# Patient Record
Sex: Male | Born: 1980 | Marital: Married | Smoking: Never smoker
Health system: Southern US, Community
[De-identification: ages and names within clinical notes are randomized; demographics above are authoritative.]

## PROBLEM LIST (undated history)

## (undated) HISTORY — PX: SEPTOPLASTY: SUR1290

---

## 2007-02-23 ENCOUNTER — Ambulatory Visit: Payer: Self-pay | Admitting: Unknown Physician Specialty

## 2008-04-28 DIAGNOSIS — G43009 Migraine without aura, not intractable, without status migrainosus: Secondary | ICD-10-CM | POA: Insufficient documentation

## 2009-04-05 DIAGNOSIS — K589 Irritable bowel syndrome without diarrhea: Secondary | ICD-10-CM | POA: Insufficient documentation

## 2013-09-07 LAB — TSH: TSH: 1.36 u[IU]/mL (ref 0.41–5.90)

## 2013-09-07 LAB — CBC AND DIFFERENTIAL
HEMATOCRIT: 45 % (ref 41–53)
HEMOGLOBIN: 15.7 g/dL (ref 13.5–17.5)
NEUTROS ABS: 47 /uL
Platelets: 299 10*3/uL (ref 150–399)
WBC: 7 10*3/mL

## 2013-09-07 LAB — BASIC METABOLIC PANEL
BUN: 17 mg/dL (ref 4–21)
Creatinine: 1 mg/dL (ref 0.6–1.3)
GLUCOSE: 95 mg/dL
POTASSIUM: 4.9 mmol/L (ref 3.4–5.3)
SODIUM: 141 mmol/L (ref 137–147)

## 2013-09-07 LAB — LIPID PANEL
CHOLESTEROL: 257 mg/dL — AB (ref 0–200)
HDL: 50 mg/dL (ref 35–70)
LDL CALC: 177 mg/dL
Triglycerides: 149 mg/dL (ref 40–160)

## 2013-09-07 LAB — HEPATIC FUNCTION PANEL
ALT: 41 U/L — AB (ref 10–40)
AST: 27 U/L (ref 14–40)
Alkaline Phosphatase: 133 U/L — AB (ref 25–125)
Bilirubin, Total: 0.4 mg/dL

## 2015-04-10 ENCOUNTER — Ambulatory Visit: Payer: Self-pay | Admitting: Physician Assistant

## 2015-04-10 ENCOUNTER — Ambulatory Visit (INDEPENDENT_AMBULATORY_CARE_PROVIDER_SITE_OTHER): Payer: Commercial Managed Care - HMO | Admitting: Physician Assistant

## 2015-04-10 ENCOUNTER — Encounter: Payer: Self-pay | Admitting: Physician Assistant

## 2015-04-10 VITALS — BP 118/90 | HR 72 | Temp 98.8°F | Resp 16 | Wt 223.4 lb

## 2015-04-10 DIAGNOSIS — J069 Acute upper respiratory infection, unspecified: Secondary | ICD-10-CM | POA: Diagnosis not present

## 2015-04-10 DIAGNOSIS — H6503 Acute serous otitis media, bilateral: Secondary | ICD-10-CM

## 2015-04-10 DIAGNOSIS — M7050 Other bursitis of knee, unspecified knee: Secondary | ICD-10-CM | POA: Insufficient documentation

## 2015-04-10 DIAGNOSIS — R05 Cough: Secondary | ICD-10-CM

## 2015-04-10 DIAGNOSIS — R059 Cough, unspecified: Secondary | ICD-10-CM

## 2015-04-10 DIAGNOSIS — F32A Depression, unspecified: Secondary | ICD-10-CM | POA: Insufficient documentation

## 2015-04-10 DIAGNOSIS — F329 Major depressive disorder, single episode, unspecified: Secondary | ICD-10-CM | POA: Insufficient documentation

## 2015-04-10 DIAGNOSIS — E669 Obesity, unspecified: Secondary | ICD-10-CM | POA: Insufficient documentation

## 2015-04-10 MED ORDER — IBUPROFEN 800 MG PO TABS: 800.0000 mg | ORAL_TABLET | Freq: Three times a day (TID) | ORAL | Status: DC | PRN

## 2015-04-10 MED ORDER — AMOXICILLIN-POT CLAVULANATE 875-125 MG PO TABS: 1.0000 | ORAL_TABLET | Freq: Two times a day (BID) | ORAL | Status: DC

## 2015-04-10 MED ORDER — HYDROCODONE-HOMATROPINE 5-1.5 MG/5ML PO SYRP: 5.0000 mL | ORAL_SOLUTION | Freq: Three times a day (TID) | ORAL | Status: DC | PRN

## 2015-04-10 NOTE — Progress Notes (Signed)
Patient ID: Shane Guerrero, Guerrero   DOB: 01-07-1981, 35 y.o.   MRN: [id number]   Patient: Shane Guerrero    DOB: 01-07-1981   34 y.o.   MRN: [id number] Visit Date: 04/10/2015  Today's Provider: Margaretann LovelessJennifer M Sharise Lippy, PA-C   Chief Complaint  Patient presents with  . URI   Subjective:    URI  This is a new problem. The current episode started in the past 7 days. The problem has been unchanged. The maximum temperature recorded prior to his arrival was 101 - 101.9 F. The fever has been present for less than 1 day. Associated symptoms include congestion, coughing, headaches, nausea, rhinorrhea, a sore throat and wheezing. Pertinent negatives include no abdominal pain, chest pain, diarrhea, ear pain, plugged ear sensation, rash, sinus pain, sneezing or vomiting. Associated symptoms comments: Fever . He has tried acetaminophen and NSAIDs (OTC cough medication) for the symptoms. The treatment provided mild relief.  He has been around sick contacts.    Previous Medications   IBUPROFEN (ADVIL,MOTRIN) 800 MG TABLET    Take by mouth.   No Known Allergies  Review of Systems  Constitutional: Positive for fever and chills.  HENT: Positive for congestion, rhinorrhea and sore throat. Negative for ear pain and sneezing.   Eyes: Negative.   Respiratory: Positive for cough and wheezing.   Cardiovascular: Negative.  Negative for chest pain.  Gastrointestinal: Positive for nausea. Negative for vomiting, abdominal pain and diarrhea.  Endocrine: Negative.   Genitourinary: Negative.   Musculoskeletal: Negative.   Skin: Negative.  Negative for rash.  Allergic/Immunologic: Negative.   Neurological: Positive for headaches. Negative for dizziness.  Hematological: Negative.   Psychiatric/Behavioral: Negative.     Social History  Substance Use Topics  . Smoking status: Never Smoker   . Smokeless tobacco: Never Used  . Alcohol Use: 0.0 oz/week    0 Standard drinks or equivalent per week     Comment:  occasionally   Objective:   BP 118/90 mmHg  Pulse 72  Temp(Src) 98.8 F (37.1 C) (Oral)  Resp 16  Wt 223 lb 6.4 oz (101.334 kg)  Physical Exam  Constitutional: He appears well-developed and well-nourished. No distress.  HENT:  Head: Normocephalic and atraumatic.  Right Ear: Hearing, external ear and ear canal normal. Tympanic membrane is not erythematous and not bulging. A middle ear effusion is present.  Left Ear: Hearing and ear canal normal. Tympanic membrane is not erythematous and not bulging. A middle ear effusion is present.  Nose: Mucosal edema and rhinorrhea present. Right sinus exhibits no maxillary sinus tenderness and no frontal sinus tenderness. Left sinus exhibits no maxillary sinus tenderness and no frontal sinus tenderness.  Mouth/Throat: Uvula is midline and mucous membranes are normal. Posterior oropharyngeal erythema present. No oropharyngeal exudate or posterior oropharyngeal edema.  Eyes: Conjunctivae and EOM are normal. Pupils are equal, round, and reactive to light. Right eye exhibits no discharge. Left eye exhibits no discharge.  Neck: Normal range of motion. Neck supple. No tracheal deviation present. No Brudzinski's sign and no Kernig's sign noted. No thyromegaly present.  Cardiovascular: Normal rate, regular rhythm and normal heart sounds.  Exam reveals no gallop and no friction rub.   No murmur heard. Pulmonary/Chest: Effort normal and breath sounds normal. No stridor. No respiratory distress. He has no wheezes. He has no rales.  Lymphadenopathy:    He has cervical adenopathy.  Skin: Skin is warm and dry. He is not diaphoretic.  Vitals reviewed.  Assessment & Plan:     1. Upper respiratory infection Worsening symptoms with high fever and otitis media that has not responded to OTC medications.  Will treat with augmentin as below.  Advised he may continue Mucinex DM for congestion.  He needs to stay well hydrated and get plenty of rest.  May alternate  tylenol and IBU if needed for fevers and body aches. He is to call the office if symptoms fail to improve or worsen. - amoxicillin-clavulanate (AUGMENTIN) 875-125 MG tablet; Take 1 tablet by mouth 2 (two) times daily.  Dispense: 20 tablet; Refill: 0  2. Bilateral acute serous otitis media, recurrence not specified See above medical treatment plan. - amoxicillin-clavulanate (AUGMENTIN) 875-125 MG tablet; Take 1 tablet by mouth 2 (two) times daily.  Dispense: 20 tablet; Refill: 0  3. Cough Worsening nighttime cough that is affecting sleep.  Will give hycodan cough syrup as below. Advised of drowsiness precautions. He is to take Mucinex DM for daytime cough. Stay well hydrated.  Call if no symptom improvement. - HYDROcodone-homatropine (HYCODAN) 5-1.5 MG/5ML syrup; Take 5 mLs by mouth every 8 (eight) hours as needed for cough.  Dispense: 180 mL; Refill: 0  4. Bursitis of knee, unspecified laterality Stable.  Diagnosis pulled for medication refill as below. Continue current medical treatment plan. - ibuprofen (ADVIL,MOTRIN) 800 MG tablet; Take 1 tablet (800 mg total) by mouth every 8 (eight) hours as needed.  Dispense: 30 tablet; Refill: 3   Follow up: No Follow-up on file.

## 2015-04-10 NOTE — Patient Instructions (Signed)
Upper Respiratory Infection, Adult  Most upper respiratory infections (URIs) are a viral infection of the air passages leading to the lungs. A URI affects the nose, throat, and upper air passages. The most common type of URI is nasopharyngitis and is typically referred to as "the common cold."  URIs run their course and usually go away on their own. Most of the time, a URI does not require medical attention, but sometimes a bacterial infection in the upper airways can follow a viral infection. This is called a secondary infection. Sinus and middle ear infections are common types of secondary upper respiratory infections.  Bacterial pneumonia can also complicate a URI. A URI can worsen asthma and chronic obstructive pulmonary disease (COPD). Sometimes, these complications can require emergency medical care and may be life threatening.   CAUSES  Almost all URIs are caused by viruses. A virus is a type of germ and can spread from one person to another.   RISKS FACTORS  You may be at risk for a URI if:    You smoke.    You have chronic heart or lung disease.   You have a weakened defense (immune) system.    You are very young or very old.    You have nasal allergies or asthma.   You work in crowded or poorly ventilated areas.   You work in health care facilities or schools.  SIGNS AND SYMPTOMS   Symptoms typically develop 2-3 days after you come in contact with a cold virus. Most viral URIs last 7-10 days. However, viral URIs from the influenza virus (flu virus) can last 14-18 days and are typically more severe. Symptoms may include:    Runny or stuffy (congested) nose.    Sneezing.    Cough.    Sore throat.    Headache.    Fatigue.    Fever.    Loss of appetite.    Pain in your forehead, behind your eyes, and over your cheekbones (sinus pain).   Muscle aches.   DIAGNOSIS   Your health care provider may diagnose a URI by:   Physical exam.   Tests to check that your symptoms are not due to  another condition such as:   Strep throat.   Sinusitis.   Pneumonia.   Asthma.  TREATMENT   A URI goes away on its own with time. It cannot be cured with medicines, but medicines may be prescribed or recommended to relieve symptoms. Medicines may help:   Reduce your fever.   Reduce your cough.   Relieve nasal congestion.  HOME CARE INSTRUCTIONS    Take medicines only as directed by your health care provider.    Gargle warm saltwater or take cough drops to comfort your throat as directed by your health care provider.   Use a warm mist humidifier or inhale steam from a shower to increase air moisture. This may make it easier to breathe.   Drink enough fluid to keep your urine clear or pale yellow.    Eat soups and other clear broths and maintain good nutrition.    Rest as needed.    Return to work when your temperature has returned to normal or as your health care provider advises. You may need to stay home longer to avoid infecting others. You can also use a face mask and careful hand washing to prevent spread of the virus.   Increase the usage of your inhaler if you have asthma.    Do not   use any tobacco products, including cigarettes, chewing tobacco, or electronic cigarettes. If you need help quitting, ask your health care provider.  PREVENTION   The best way to protect yourself from getting a cold is to practice good hygiene.    Avoid oral or hand contact with people with cold symptoms.    Wash your hands often if contact occurs.   There is no clear evidence that vitamin C, vitamin E, echinacea, or exercise reduces the chance of developing a cold. However, it is always recommended to get plenty of rest, exercise, and practice good nutrition.   SEEK MEDICAL CARE IF:    You are getting worse rather than better.    Your symptoms are not controlled by medicine.    You have chills.   You have worsening shortness of breath.   You have brown or red mucus.   You have yellow or brown nasal  discharge.   You have pain in your face, especially when you bend forward.   You have a fever.   You have swollen neck glands.   You have pain while swallowing.   You have white areas in the back of your throat.  SEEK IMMEDIATE MEDICAL CARE IF:    You have severe or persistent:    Headache.    Ear pain.    Sinus pain.    Chest pain.   You have chronic lung disease and any of the following:    Wheezing.    Prolonged cough.    Coughing up blood.    A change in your usual mucus.   You have a stiff neck.   You have changes in your:    Vision.    Hearing.    Thinking.    Mood.  MAKE SURE YOU:    Understand these instructions.   Will watch your condition.   Will get help right away if you are not doing well or get worse.     This information is not intended to replace advice given to you by your health care provider. Make sure you discuss any questions you have with your health care provider.     Document Released: 07/15/2000 Document Revised: 06/05/2014 Document Reviewed: 04/26/2013  Elsevier Interactive Patient Education 2016 [employer]  Serous Otitis Media  Serous otitis media is fluid in the middle ear space. This space contains the bones for hearing and air. Air in the middle ear space helps to transmit sound.   The air gets there through the eustachian tube. This tube goes from the back of the nose (nasopharynx) to the middle ear space. It keeps the pressure in the middle ear the same as the outside world. It also helps to drain fluid from the middle ear space.  CAUSES   Serous otitis media occurs when the eustachian tube gets blocked. Blockage can come from:   Ear infections.   Colds and other upper respiratory infections.   Allergies.   Irritants such as cigarette smoke.   Sudden changes in air pressure (such as descending in an airplane).   Enlarged adenoids.   A mass in the nasopharynx.  During colds and upper respiratory infections, the middle ear space can become temporarily filled with  fluid. This can happen after an ear infection also. Once the infection clears, the fluid will generally drain out of the ear through the eustachian tube. If it does not, then serous otitis media occurs.  SIGNS AND SYMPTOMS    Hearing loss.   A   feeling of fullness in the ear, without pain.   Young children may not show any symptoms but may show slight behavioral changes, such as agitation, ear pulling, or crying.  DIAGNOSIS   Serous otitis media is diagnosed by an ear exam. Tests may be done to check on the movement of the eardrum. Hearing exams may also be done.  TREATMENT   The fluid most often goes away without treatment. If allergy is the cause, allergy treatment may be helpful. Fluid that persists for several months may require minor surgery. A small tube is placed in the eardrum to:   Drain the fluid.   Restore the air in the middle ear space.  In certain situations, antibiotic medicines are used to avoid surgery. Surgery may be done to remove enlarged adenoids (if this is the cause).  HOME CARE INSTRUCTIONS    Keep children away from tobacco smoke.   Keep all follow-up visits as directed by your health care provider.  SEEK MEDICAL CARE IF:    Your hearing is not better in 3 months.   Your hearing is worse.   You have ear pain.   You have drainage from the ear.   You have dizziness.   You have serous otitis media only in one ear or have any bleeding from your nose (epistaxis).   You notice a lump on your neck.  MAKE SURE YOU:   Understand these instructions.    Will watch your condition.    Will get help right away if you are not doing well or get worse.      This information is not intended to replace advice given to you by your health care provider. Make sure you discuss any questions you have with your health care provider.     Document Released: 04/11/2003 Document Revised: 02/09/2014 Document Reviewed: 08/16/2012  Elsevier Interactive Patient Education 2016 [employer] Inc[employer]

## 2015-08-08 ENCOUNTER — Ambulatory Visit (INDEPENDENT_AMBULATORY_CARE_PROVIDER_SITE_OTHER): Payer: Commercial Managed Care - HMO | Admitting: Family Medicine

## 2015-08-08 ENCOUNTER — Encounter: Payer: Self-pay | Admitting: Family Medicine

## 2015-08-08 VITALS — BP 110/82 | HR 60 | Temp 98.2°F | Resp 16 | Wt 227.0 lb

## 2015-08-08 DIAGNOSIS — J40 Bronchitis, not specified as acute or chronic: Secondary | ICD-10-CM | POA: Insufficient documentation

## 2015-08-08 MED ORDER — AZITHROMYCIN 250 MG PO TABS: ORAL_TABLET | ORAL | Status: DC

## 2015-08-08 NOTE — Progress Notes (Signed)
       Patient: Shane Guerrero Male    DOB: Oct 24, 1980   34 y.o.   MRN: [id number] Visit Date: 08/08/2015  Today's Provider: Megan Mansichard Gilbert Jr, MD   Chief Complaint  Patient presents with  . URI   Subjective:    URI  This is a new problem. Episode onset: x several weeks. The problem has been unchanged. There has been no fever. Associated symptoms include congestion, coughing (dry), headaches (unchanged per pt), rhinorrhea, sinus pain (at onset; has improved), a sore throat and wheezing. Pertinent negatives include no abdominal pain, chest pain, ear pain, nausea, neck pain, plugged ear sensation, sneezing, swollen glands or vomiting. Treatments tried: Mucinex. The treatment provided no relief.       No Known Allergies Current Meds  Medication Sig  . ibuprofen (ADVIL,MOTRIN) 800 MG tablet Take 1 tablet (800 mg total) by mouth every 8 (eight) hours as needed.    Review of Systems  HENT: Positive for congestion, rhinorrhea and sore throat. Negative for ear pain and sneezing.   Eyes: Negative.   Respiratory: Positive for cough (dry) and wheezing.   Cardiovascular: Negative for chest pain.  Gastrointestinal: Negative for nausea, vomiting and abdominal pain.  Musculoskeletal: Negative for neck pain.  Neurological: Positive for headaches (unchanged per pt).    Social History  Substance Use Topics  . Smoking status: Never Smoker   . Smokeless tobacco: Never Used  . Alcohol Use: 0.0 oz/week    0 Standard drinks or equivalent per week     Comment: occasionally   Objective:   BP 110/82 mmHg  Pulse 60  Temp(Src) 98.2 F (36.8 C) (Oral)  Resp 16  Wt 227 lb (102.967 kg)  SpO2 98%  Physical Exam  Constitutional: He is oriented to person, place, and time. He appears well-developed and well-nourished.  HENT:  Head: Normocephalic and atraumatic.  Right Ear: Tympanic membrane and external ear normal.  Left Ear: Tympanic membrane and external ear normal.  Nose: Nose normal.    Mouth/Throat: Oropharynx is clear and moist. No oropharyngeal exudate.  Eyes: Conjunctivae are normal. Pupils are equal, round, and reactive to light.  Neck: Normal range of motion. Neck supple. No thyromegaly present.  Cardiovascular: Normal rate, regular rhythm and normal heart sounds.   Pulmonary/Chest: Effort normal and breath sounds normal. No respiratory distress.  Abdominal: Soft.  Lymphadenopathy:    He has no cervical adenopathy.  Neurological: He is alert and oriented to person, place, and time.  Skin: Skin is warm and dry.  Psychiatric: He has a normal mood and affect. His behavior is normal. Judgment and thought content normal.        Assessment & Plan:     1. Bronchitis New problem. Treat as below. Call if sx worsen or do not improve. - azithromycin (ZITHROMAX) 250 MG tablet; Take 2 tablets on the first day, and then 1 tablet each remaining day until gone.  Dispense: 6 tablet; Refill: 0     Patient seen and examined by Julieanne Mansonichard Gilbert, MD, and note scribed by Allene DillonEmily Drozdowski, CMA.  I have done the exam and reviewed the above chart and it is accurate to the best of my knowledge.  Richard Wendelyn BreslowGilbert Jr, MD  [facility]

## 2015-09-24 ENCOUNTER — Ambulatory Visit (INDEPENDENT_AMBULATORY_CARE_PROVIDER_SITE_OTHER): Payer: Commercial Managed Care - HMO | Admitting: Family Medicine

## 2015-09-24 ENCOUNTER — Encounter: Payer: Self-pay | Admitting: Family Medicine

## 2015-09-24 VITALS — BP 118/82 | HR 60 | Resp 12 | Wt 230.0 lb

## 2015-09-24 DIAGNOSIS — R6882 Decreased libido: Secondary | ICD-10-CM | POA: Diagnosis not present

## 2015-09-24 DIAGNOSIS — F329 Major depressive disorder, single episode, unspecified: Secondary | ICD-10-CM | POA: Diagnosis not present

## 2015-09-24 DIAGNOSIS — R5383 Other fatigue: Secondary | ICD-10-CM

## 2015-09-24 DIAGNOSIS — F32A Depression, unspecified: Secondary | ICD-10-CM

## 2015-09-24 MED ORDER — CITALOPRAM HYDROBROMIDE 20 MG PO TABS: 20.0000 mg | ORAL_TABLET | Freq: Every day | ORAL | 12 refills | Status: DC

## 2015-09-24 NOTE — Progress Notes (Signed)
Subjective:  HPI  Patient is here to discuss depression. This was an issue years ago and patient and wife state he took Celexa at that time after Cymbalta gave patient excessive sweatiness. He is not sure why Celexa was stopped and he is not sure if it worked well for him or not since its been a long time. For the past 2 months he has gotten depressed again, symptoms are fatigue, lost interest in things, easily irritates, anxious at times. They are trying to buy a house and have been working on finances and patient works a lot per wife. [facility] 09/24/2015  Decreased Interest 2  Down, Depressed, Hopeless 0  PHQ - 2 Score 2  Altered sleeping 0  Tired, decreased energy 2  Change in appetite 1  Feeling bad or failure about yourself  1  Trouble concentrating 2  Moving slowly or fidgety/restless 1  Suicidal thoughts 0  PHQ-9 Score 9  Difficult doing work/chores Somewhat difficult   Prior to Admission medications   Medication Sig Start Date End Date Taking? Authorizing Provider  ibuprofen (ADVIL,MOTRIN) 800 MG tablet Take 1 tablet (800 mg total) by mouth every 8 (eight) hours as needed. 04/10/15  Yes Margaretann LovelessJennifer M Burnette, PA-C    Patient Active Problem List   Diagnosis Date Noted  . Bronchitis 08/08/2015  . Bursitis of knee 04/10/2015  . Clinical depression 04/10/2015  . Adiposity 04/10/2015  . 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 Migraine without aura and responsive to treatment 04/28/2008    No past medical history on file.  Social History   Social History  . Marital status: Married    Spouse name: N/A  . Number of children: N/A  . Years of education: N/A   Occupational History  . Not on file.   Social History Main Topics  . Smoking status: Never Smoker  . Smokeless tobacco: Never Used  . Alcohol use 0.0 oz/week     Comment: occasionally  . Drug use: No  . Sexual activity: Not on file   Other Topics Concern  . Not on file   Social History Narrative    . No narrative on file    No Known Allergies  Review of Systems  Constitutional: Positive for malaise/fatigue.  Respiratory: Negative.   Cardiovascular: Negative.   Musculoskeletal: Negative.   Psychiatric/Behavioral: Positive for depression. Negative for memory loss, substance abuse and suicidal ideas. The patient is nervous/anxious (some). The patient does not have insomnia.     Immunization History  Administered Date(s) Administered  . Tdap 08/16/2013   Objective:  BP 118/82   Pulse 60   Resp 12   Wt 230 lb (104.3 kg)   Physical Exam  Constitutional: He is oriented to person, place, and time and well-developed, well-nourished, and in no distress.  HENT:  Head: Normocephalic and atraumatic.  Left Ear: External ear normal.  Nose: Nose normal.  Eyes: Conjunctivae are normal. Pupils are equal, round, and reactive to light.  Neck: Normal range of motion. Neck supple. No thyromegaly present.  Cardiovascular: Normal rate, regular rhythm, normal heart sounds and intact distal pulses.   No murmur heard. Pulmonary/Chest: Effort normal and breath sounds normal. No respiratory distress. He has no wheezes.  Abdominal: Soft.  Musculoskeletal: Normal range of motion. He exhibits no edema or tenderness.  Lymphadenopathy:    He has no cervical adenopathy.  Neurological: He is alert and oriented to person, place, and time. He displays normal reflexes. No cranial nerve deficit.  Gait normal. Coordination normal.  Skin: Skin is warm.  Psychiatric: Mood, memory and judgment normal. He has a flat affect.    Lab Results  Component Value Date   WBC 7.0 09/07/2013   HGB 15.7 09/07/2013   HCT 45 09/07/2013   PLT 299 09/07/2013   CHOL 257 (A) 09/07/2013   TRIG 149 09/07/2013   HDL 50 09/07/2013   LDLCALC 177 09/07/2013   TSH 1.36 09/07/2013    CMP     Component Value Date/Time   NA 141 09/07/2013   K 4.9 09/07/2013   BUN 17 09/07/2013   CREATININE 1.0 09/07/2013   AST 27  09/07/2013   ALT 41 (A) 09/07/2013   ALKPHOS 133 (A) 09/07/2013    Assessment and Plan :  1. Clinical depression Worsening. Will try Celexa again. Avoid SNRI due to side effect from the past for patient. F/U 1 month. Consider bupropion. This is a long-standing issue with this patient so may consider referral to psychiatry. 2. Decreased sex drive Will check labs in case symptoms are coming from other issue then just depression. - Testosterone - TSH - CBC w/Diff/Platelet - Comprehensive metabolic panel  3. Other fatigue Pending results. - Testosterone - TSH - CBC w/Diff/Platelet - Comprehensive metabolic panel  Patient was seen and examined by Dr. Bosie Closichard L  and note was scribed by Samara DeistAnastasiya Aleksandrova, RMA.  Julieanne Mansonichard  MD [facility] 09/24/2015 11:05 AM

## 2015-09-25 LAB — CBC WITH DIFFERENTIAL/PLATELET
BASOS: 1 %
Basophils Absolute: 0.1 10*3/uL (ref 0.0–0.2)
EOS (ABSOLUTE): 0.4 10*3/uL (ref 0.0–0.4)
Eos: 5 %
HEMOGLOBIN: 15.3 g/dL (ref 12.6–17.7)
Hematocrit: 44.7 % (ref 37.5–51.0)
IMMATURE GRANS (ABS): 0 10*3/uL (ref 0.0–0.1)
IMMATURE GRANULOCYTES: 0 %
LYMPHS: 36 %
Lymphocytes Absolute: 2.6 10*3/uL (ref 0.7–3.1)
MCH: 26.7 pg (ref 26.6–33.0)
MCHC: 34.2 g/dL (ref 31.5–35.7)
MCV: 78 fL — AB (ref 79–97)
MONOCYTES: 8 %
Monocytes Absolute: 0.6 10*3/uL (ref 0.1–0.9)
NEUTROS PCT: 50 %
Neutrophils Absolute: 3.6 10*3/uL (ref 1.4–7.0)
PLATELETS: 301 10*3/uL (ref 150–379)
RBC: 5.72 x10E6/uL (ref 4.14–5.80)
RDW: 13.7 % (ref 12.3–15.4)
WBC: 7.3 10*3/uL (ref 3.4–10.8)

## 2015-09-25 LAB — COMPREHENSIVE METABOLIC PANEL
A/G RATIO: 1.7 (ref 1.2–2.2)
ALBUMIN: 4.3 g/dL (ref 3.5–5.5)
ALT: 38 IU/L (ref 0–44)
AST: 25 IU/L (ref 0–40)
Alkaline Phosphatase: 130 IU/L — ABNORMAL HIGH (ref 39–117)
BUN/Creatinine Ratio: 15 (ref 9–20)
BUN: 12 mg/dL (ref 6–20)
Bilirubin Total: 0.4 mg/dL (ref 0.0–1.2)
CALCIUM: 9.4 mg/dL (ref 8.7–10.2)
CO2: 22 mmol/L (ref 18–29)
Chloride: 102 mmol/L (ref 96–106)
Creatinine, Ser: 0.8 mg/dL (ref 0.76–1.27)
GFR, EST AFRICAN AMERICAN: 134 mL/min/{1.73_m2} (ref >59)
GFR, EST NON AFRICAN AMERICAN: 116 mL/min/{1.73_m2} (ref >59)
GLUCOSE: 85 mg/dL (ref 65–99)
Globulin, Total: 2.6 g/dL (ref 1.5–4.5)
Potassium: 4.4 mmol/L (ref 3.5–5.2)
Sodium: 138 mmol/L (ref 134–144)
TOTAL PROTEIN: 6.9 g/dL (ref 6.0–8.5)

## 2015-09-25 LAB — TSH: TSH: 1.66 u[IU]/mL (ref 0.450–4.500)

## 2015-09-25 LAB — TESTOSTERONE: TESTOSTERONE: 261 ng/dL — AB (ref 264–916)

## 2015-09-27 ENCOUNTER — Telehealth: Payer: Self-pay

## 2015-09-27 NOTE — Telephone Encounter (Signed)
-----   Message from Maple Hudsonichard L Gilbert Jr., MD sent at 09/25/2015  3:38 PM EDT ----- Labs okay. Testosterone the low side of normal. Keep follow-up in a month

## 2015-09-27 NOTE — Telephone Encounter (Signed)
Patient advised as below.  

## 2015-10-21 ENCOUNTER — Encounter: Payer: Self-pay | Admitting: Family Medicine

## 2015-10-21 ENCOUNTER — Ambulatory Visit (INDEPENDENT_AMBULATORY_CARE_PROVIDER_SITE_OTHER): Payer: Commercial Managed Care - HMO | Admitting: Family Medicine

## 2015-10-21 VITALS — BP 102/62 | HR 68 | Temp 97.8°F | Resp 14 | Ht 73.0 in | Wt 227.0 lb

## 2015-10-21 DIAGNOSIS — R110 Nausea: Secondary | ICD-10-CM

## 2015-10-21 DIAGNOSIS — F329 Major depressive disorder, single episode, unspecified: Secondary | ICD-10-CM | POA: Diagnosis not present

## 2015-10-21 DIAGNOSIS — F32A Depression, unspecified: Secondary | ICD-10-CM

## 2015-10-21 MED ORDER — SERTRALINE HCL 50 MG PO TABS: 50.0000 mg | ORAL_TABLET | Freq: Every day | ORAL | 3 refills | Status: DC

## 2015-10-21 NOTE — Patient Instructions (Addendum)
Start new medication take 1/2 of the Sertraline 50 mg the first couple of days then take one daily. If causes you to have nausea also, we will try Wellbutrin.

## 2015-10-21 NOTE — Progress Notes (Signed)
Subjective:  HPI Pt is here today for a 1 month follow up of depression. He reports that he was started on Celexa and he did not tolerate it well. It made him sick to his stomach/nauseated everyday. He was taking it in the mornings but switched it to bedtime and the nausea did not change. He stopped taking it at the end of last week and the nausea has resolved. Pt would like to try a different Medication.   Prior to Admission medications   Medication Sig Start Date End Date Taking? Authorizing Provider  citalopram (CELEXA) 20 MG tablet Take 1 tablet (20 mg total) by mouth daily. 09/24/15   Hinton Luellen Hulen ShoutsL  Jr., MD  ibuprofen (ADVIL,MOTRIN) 800 MG tablet Take 1 tablet (800 mg total) by mouth every 8 (eight) hours as needed. 04/10/15   Margaretann LovelessJennifer M Burnette, PA-C    Patient Active Problem List   Diagnosis Date Noted  . Bronchitis 08/08/2015  . Bursitis of knee 04/10/2015  . Clinical depression 04/10/2015  . Adiposity 04/10/2015  . 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 Migraine without aura and responsive to treatment 04/28/2008    History reviewed. No pertinent past medical history.  Social History   Social History  . Marital status: Married    Spouse name: N/A  . Number of children: N/A  . Years of education: N/A   Occupational History  . Not on file.   Social History Main Topics  . Smoking status: Never Smoker  . Smokeless tobacco: Never Used  . Alcohol use 0.0 oz/week     Comment: occasionally  . Drug use: No  . Sexual activity: Not on file   Other Topics Concern  . Not on file   Social History Narrative  . No narrative on file    No Known Allergies  Review of Systems  Constitutional: Negative.   HENT: Negative.   Eyes: Negative.   Respiratory: Negative.   Cardiovascular: Negative.   Gastrointestinal: Positive for nausea (not currently but had it while taking Celexa).  Genitourinary: Negative.   Musculoskeletal: Negative.   Skin: Negative.   Neurological:  Negative.   Endo/Heme/Allergies: Negative.   Psychiatric/Behavioral: Positive for depression.    Immunization History  Administered Date(s) Administered  . Tdap 08/16/2013   Objective:  BP 102/62 (BP Location: Left Arm, Patient Position: Sitting, Cuff Size: Large)   Pulse 68   Temp 97.8 F (36.6 C) (Oral)   Resp 14   Wt 227 lb (103 kg)   Physical Exam  Constitutional: He is well-developed, well-nourished, and in no distress.  HENT:  Head: Normocephalic and atraumatic.  Eyes: Conjunctivae and EOM are normal. Pupils are equal, round, and reactive to light.  Neck: Normal range of motion. Neck supple.  Cardiovascular: Normal rate, regular rhythm, normal heart sounds and intact distal pulses.   Pulmonary/Chest: Effort normal and breath sounds normal.  Abdominal: Soft.  Musculoskeletal: Normal range of motion.  Neurological: He is alert. He has normal reflexes. Gait normal. GCS score is 15.  Skin: Skin is warm and dry.  Psychiatric: Memory, affect and judgment normal.    Lab Results  Component Value Date   WBC 7.3 09/24/2015   HGB 15.7 09/07/2013   HCT 44.7 09/24/2015   PLT 301 09/24/2015   GLUCOSE 85 09/24/2015   CHOL 257 (A) 09/07/2013   TRIG 149 09/07/2013   HDL 50 09/07/2013   LDLCALC 177 09/07/2013   TSH 1.660 09/24/2015    CMP     Component  Value Date/Time   NA 138 09/24/2015 1133   K 4.4 09/24/2015 1133   CL 102 09/24/2015 1133   CO2 22 09/24/2015 1133   GLUCOSE 85 09/24/2015 1133   BUN 12 09/24/2015 1133   CREATININE 0.80 09/24/2015 1133   CALCIUM 9.4 09/24/2015 1133   PROT 6.9 09/24/2015 1133   ALBUMIN 4.3 09/24/2015 1133   AST 25 09/24/2015 1133   ALT 38 09/24/2015 1133   ALKPHOS 130 (H) 09/24/2015 1133   BILITOT 0.4 09/24/2015 1133   GFRNONAA 116 09/24/2015 1133   GFRAA 134 09/24/2015 1133    Assessment and Plan :  1. Clinical depression Not improved. Try Sertraline since having side effect to Celexa. If Sertraline has same affect will try  Wellbutrin. Follow up in 1 month.  - sertraline (ZOLOFT) 50 MG tablet; Take 1 tablet (50 mg total) by mouth daily.  Dispense: 30 tablet; Refill: 3  2. Nausea Resolved since stopping Celexa.   HPI, Exam, and A&P Transcribed under the direction and in the presence of Sharea Guinther L. Wendelyn Breslow, MD  Electronically Signed: Dimas Chyle, CMA I have done the exam and reviewed the chart and it is accurate to the best of my knowledge. Julieanne Manson M.D. [facility]  Julieanne Manson MD [facility] 10/21/2015 9:53 AM

## 2015-10-22 ENCOUNTER — Ambulatory Visit: Payer: Commercial Managed Care - HMO | Admitting: Family Medicine

## 2015-11-19 ENCOUNTER — Ambulatory Visit: Payer: Commercial Managed Care - HMO | Admitting: Family Medicine

## 2016-01-15 ENCOUNTER — Ambulatory Visit: Payer: Commercial Managed Care - HMO | Admitting: Family Medicine

## 2016-01-20 ENCOUNTER — Ambulatory Visit (INDEPENDENT_AMBULATORY_CARE_PROVIDER_SITE_OTHER): Payer: Commercial Managed Care - HMO | Admitting: Family Medicine

## 2016-01-20 ENCOUNTER — Encounter: Payer: Self-pay | Admitting: Family Medicine

## 2016-01-20 VITALS — BP 102/68 | HR 66 | Temp 97.9°F | Resp 14 | Wt 229.0 lb

## 2016-01-20 DIAGNOSIS — F32A Depression, unspecified: Secondary | ICD-10-CM

## 2016-01-20 DIAGNOSIS — F329 Major depressive disorder, single episode, unspecified: Secondary | ICD-10-CM

## 2016-01-20 DIAGNOSIS — Z23 Encounter for immunization: Secondary | ICD-10-CM

## 2016-01-20 MED ORDER — BUPROPION HCL ER (XL) 150 MG PO TB24: 150.0000 mg | ORAL_TABLET | Freq: Every day | ORAL | 11 refills | Status: DC

## 2016-01-20 NOTE — Progress Notes (Signed)
Subjective:  HPI Depression- Pt is here for a 4 week follow up of depression. He was started on Zoloft 50 mg. PT reports that he has not noticed any difference with the medication. He is still not wanting to do anything. He comes home from work, watches his baby for a little bit and then goes to bed. Wife is with him today and she also says that she has not noticed a difference with the medication. No side effects.   Prior to Admission medications   Medication Sig Start Date End Date Taking? Authorizing Provider  ibuprofen (ADVIL,MOTRIN) 800 MG tablet Take 1 tablet (800 mg total) by mouth every 8 (eight) hours as needed. 04/10/15   Margaretann LovelessJennifer M Burnette, PA-C  sertraline (ZOLOFT) 50 MG tablet Take 1 tablet (50 mg total) by mouth daily. 10/21/15   Richard Hulen ShoutsL Gilbert Jr., MD    Patient Active Problem List   Diagnosis Date Noted  . Bronchitis 08/08/2015  . Bursitis of knee 04/10/2015  . Clinical depression 04/10/2015  . Adiposity 04/10/2015  . 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 Migraine without aura and responsive to treatment 04/28/2008    History reviewed. No pertinent past medical history.  Social History   Social History  . Marital status: Married    Spouse name: N/A  . Number of children: N/A  . Years of education: N/A   Occupational History  . Not on file.   Social History Main Topics  . Smoking status: Never Smoker  . Smokeless tobacco: Never Used  . Alcohol use 0.0 oz/week     Comment: occasionally  . Drug use: No  . Sexual activity: Not on file   Other Topics Concern  . Not on file   Social History Narrative  . No narrative on file    No Known Allergies  Review of Systems  Constitutional: Positive for malaise/fatigue.  HENT: Negative.   Eyes: Negative.   Respiratory: Negative.   Cardiovascular: Negative.   Gastrointestinal: Negative.   Genitourinary: Negative.   Musculoskeletal: Negative.   Skin: Negative.   Neurological: Negative.   Endo/Heme/Allergies:  Negative.   Psychiatric/Behavioral: Positive for depression.  Not suicidal and there are no signs of any mania now or in the past  Immunization History  Administered Date(s) Administered  . Tdap 08/16/2013    Objective:  BP 102/68 (BP Location: Left Arm, Patient Position: Sitting, Cuff Size: Large)   Pulse 66   Temp 97.9 F (36.6 C) (Oral)   Resp 14   Wt 229 lb (103.9 kg)   BMI 30.21 kg/m   [facility] 2[facility] 01/20/2016 09/24/2015  Decreased Interest 2 2  Down, Depressed, Hopeless 2 0  PHQ - 2 Score 4 2  Altered sleeping 2 0  Tired, decreased energy 3 2  Change in appetite 0 1  Feeling bad or failure about yourself  1 1  Trouble concentrating - 2  Moving slowly or fidgety/restless 2 1  Suicidal thoughts 0 0  PHQ-9 Score 12 9  Difficult doing work/chores Very difficult Somewhat difficult      Physical Exam  Constitutional: He is oriented to person, place, and time and well-developed, well-nourished, and in no distress.  HENT:  Head: Normocephalic and atraumatic.  Eyes: Conjunctivae and EOM are normal. Pupils are equal, round, and reactive to light.  Neck: Normal range of motion. Neck supple.  Cardiovascular: Normal rate, regular rhythm, normal heart sounds and intact distal pulses.   Pulmonary/Chest: Effort normal and breath sounds normal.  Musculoskeletal: Normal range of motion.  Neurological: He is alert and oriented to person, place, and time. He has normal reflexes. Gait normal. GCS score is 15.  Skin: Skin is warm and dry.  Psychiatric: Mood, memory, affect and judgment normal.    Lab Results  Component Value Date   WBC 7.3 09/24/2015   HGB 15.7 09/07/2013   HCT 44.7 09/24/2015   PLT 301 09/24/2015   GLUCOSE 85 09/24/2015   CHOL 257 (A) 09/07/2013   TRIG 149 09/07/2013   HDL 50 09/07/2013   LDLCALC 177 09/07/2013   TSH 1.660 09/24/2015    CMP     Component Value Date/Time   NA 138 09/24/2015 1133   K 4.4 09/24/2015 1133   CL 102  09/24/2015 1133   CO2 22 09/24/2015 1133   GLUCOSE 85 09/24/2015 1133   BUN 12 09/24/2015 1133   CREATININE 0.80 09/24/2015 1133   CALCIUM 9.4 09/24/2015 1133   PROT 6.9 09/24/2015 1133   ALBUMIN 4.3 09/24/2015 1133   AST 25 09/24/2015 1133   ALT 38 09/24/2015 1133   ALKPHOS 130 (H) 09/24/2015 1133   BILITOT 0.4 09/24/2015 1133   GFRNONAA 116 09/24/2015 1133   GFRAA 134 09/24/2015 1133    Assessment and Plan :  1. Depression, unspecified depression type PHQ-9 today is a 12. Check again on next OV. Follow up in 1 month  - buPROPion (WELLBUTRIN XL) 150 MG 24 hr tablet; Take 1 tablet (150 mg total) by mouth daily.  Dispense: 60 tablet; Refill: 11 Depressed mood and anhedonia. Consider adding a mood stabilizer or referral to psychiatry in the future. Would consider Abilify at 2 or 5 mg daily. Hopefully the Wellbutrin will help. 2. Need for influenza vaccination  - Flu Vaccine QUAD 36+ mos IM 3. Hypogonadism Also consider treatment for this as it may help the depression. Repeat testosterone and get prolactin on next visit in a month.  HPI, Exam, and A&P Transcribed under the direction and in the presence of Richard L. Wendelyn BreslowGilbert Jr, MD  Electronically Signed: Dimas ChyleBrittany Byrd, CMA I have done the exam and reviewed the above chart and it is accurate to the best of my knowledge. DentistDragon  technology has been used in this note in any air is in the dictation or transcription are unintentional.  Julieanne Mansonichard Gilbert MD [facility] 01/20/2016 10:43 AM

## 2016-01-21 ENCOUNTER — Ambulatory Visit: Payer: Commercial Managed Care - HMO | Admitting: Family Medicine

## 2016-02-18 ENCOUNTER — Encounter: Payer: Self-pay | Admitting: Family Medicine

## 2016-02-18 ENCOUNTER — Ambulatory Visit (INDEPENDENT_AMBULATORY_CARE_PROVIDER_SITE_OTHER): Payer: Commercial Managed Care - HMO | Admitting: Family Medicine

## 2016-02-18 VITALS — BP 108/72 | HR 68 | Temp 98.1°F | Resp 16 | Wt 228.0 lb

## 2016-02-18 DIAGNOSIS — F329 Major depressive disorder, single episode, unspecified: Secondary | ICD-10-CM | POA: Diagnosis not present

## 2016-02-18 DIAGNOSIS — F32A Depression, unspecified: Secondary | ICD-10-CM

## 2016-02-18 MED ORDER — BUPROPION HCL ER (XL) 300 MG PO TB24: 150.0000 mg | ORAL_TABLET | Freq: Every day | ORAL | 11 refills | Status: DC

## 2016-02-18 NOTE — Progress Notes (Signed)
       Patient: Shane Guerrero Male    DOB: 09/18/1980   35 y.o.   MRN: [id number] Visit Date: 02/18/2016  Today's Provider: Megan Mansichard Gilbert Jr, MD   Chief Complaint  Patient presents with  . Depression   Subjective:    HPI     Depression, Follow-up  He  was last seen for this 4 weeks ago. Changes made at last visit include adding Wellbutrin 150 mg. PHQ-9 was 12 at LOV.   He reports good compliance with treatment. He is not having side effects.  He reports good tolerance of treatment. Current symptoms include: difficulty concentrating He feels he is "slightly" improving since last visit.  Pt does report he does experience anxiety sometimes. ------------------------------------------------------------------------ Depression screen [facility] 2/9 02/18/2016 01/20/2016 09/24/2015  Decreased Interest 1 2 2   Down, Depressed, Hopeless 1 2 0  PHQ - 2 Score 2 4 2   Altered sleeping 1 2 0  Tired, decreased energy 2 3 2   Change in appetite 0 0 1  Feeling bad or failure about yourself  0 1 1  Trouble concentrating 2 - 2  Moving slowly or fidgety/restless 0 2 1  Suicidal thoughts 0 0 0  PHQ-9 Score 7 12 9   Difficult doing work/chores - Very difficult Somewhat difficult     No Known Allergies   Current Outpatient Prescriptions:  .  buPROPion (WELLBUTRIN XL) 150 MG 24 hr tablet, Take 1 tablet (150 mg total) by mouth daily., Disp: 60 tablet, Rfl: 11 .  ibuprofen (ADVIL,MOTRIN) 800 MG tablet, Take 1 tablet (800 mg total) by mouth every 8 (eight) hours as needed., Disp: 30 tablet, Rfl: 3 .  sertraline (ZOLOFT) 50 MG tablet, Take 1 tablet (50 mg total) by mouth daily., Disp: 30 tablet, Rfl: 3  Review of Systems  Constitutional: Negative for activity change, appetite change, chills, diaphoresis, fatigue, fever and unexpected weight change.  Psychiatric/Behavioral: Positive for decreased concentration. Negative for agitation and dysphoric mood. The patient is nervous/anxious.     Social  History  Substance Use Topics  . Smoking status: Never Smoker  . Smokeless tobacco: Never Used  . Alcohol use 0.0 oz/week     Comment: occasionally   Objective:   BP 108/72 (BP Location: Left Arm, Patient Position: Sitting, Cuff Size: Large)   Pulse 68   Temp 98.1 F (36.7 C) (Oral)   Resp 16   Wt 228 lb (103.4 kg)   BMI 30.08 kg/m   Physical Exam  Constitutional: He appears well-developed and well-nourished.  Cardiovascular: Normal rate, regular rhythm and normal heart sounds.   Pulmonary/Chest: Effort normal and breath sounds normal. No respiratory distress.  Psychiatric: He has a normal mood and affect. His behavior is normal.        Assessment & Plan:     1. Depression, unspecified depression type Improving but not to goal. Increase Wellbutrin to 300 mg. FU in 1-2 months. - buPROPion (WELLBUTRIN XL) 300 MG 24 hr tablet; Take 1 tablet (300 mg total) by mouth daily.  Dispense: 30 tablet; Refill: 11     Patient seen and examined by Julieanne Mansonichard Gilbert, MD, and note scribed by Allene DillonEmily Drozdowski, CMA.  Shane Wendelyn BreslowGilbert Jr, MD  [facility]

## 2016-03-31 ENCOUNTER — Ambulatory Visit (INDEPENDENT_AMBULATORY_CARE_PROVIDER_SITE_OTHER): Payer: Commercial Managed Care - HMO | Admitting: Family Medicine

## 2016-03-31 VITALS — BP 110/78 | HR 64 | Temp 97.4°F | Resp 16 | Wt 223.0 lb

## 2016-03-31 DIAGNOSIS — F32A Depression, unspecified: Secondary | ICD-10-CM

## 2016-03-31 DIAGNOSIS — F329 Major depressive disorder, single episode, unspecified: Secondary | ICD-10-CM

## 2016-03-31 MED ORDER — ARIPIPRAZOLE 5 MG PO TABS: 5.0000 mg | ORAL_TABLET | Freq: Every day | ORAL | 12 refills | Status: DC

## 2016-03-31 NOTE — Progress Notes (Signed)
Shane Guerrero  MRN: [id number] DOB: [birth year]  Subjective:  HPI   The patient is a 36 year old male who presents for follow up of his depression after his Bupropion was increased on his last visit.  The patient and his wife both agree that he has improved but they feel he is still not quite where they feel he should or could be.  He has had no adverse effects with the increas4ed dose and reports that he has been compliant.  Patient and wife describe that he is probably 30% better.  He is still not as productive as desired.  States he comes home and sits in his chair and does not get involved with activity.  Patient Active Problem List   Diagnosis Date Noted  . Bronchitis 08/08/2015  . Bursitis of knee 04/10/2015  . Clinical depression 04/10/2015  . Adiposity 04/10/2015  . 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 Migraine without aura and responsive to treatment 04/28/2008    No past medical history on file.  Social History   Social History  . Marital status: Married    Spouse name: N/A  . Number of children: N/A  . Years of education: N/A   Occupational History  . Not on file.   Social History Main Topics  . Smoking status: Never Smoker  . Smokeless tobacco: Never Used  . Alcohol use 0.0 oz/week     Comment: occasionally  . Drug use: No  . Sexual activity: Not on file   Other Topics Concern  . Not on file   Social History Narrative  . No narrative on file    Outpatient Encounter Prescriptions as of 03/31/2016  Medication Sig Note  . buPROPion (WELLBUTRIN XL) 300 MG 24 hr tablet Take 1 tablet (300 mg total) by mouth daily.   Marland Kitchen ibuprofen (ADVIL,MOTRIN) 800 MG tablet Take 1 tablet (800 mg total) by mouth every 8 (eight) hours as needed. 09/24/2015: prn  . sertraline (ZOLOFT) 50 MG tablet Take 1 tablet (50 mg total) by mouth daily.    No facility-administered encounter medications on file as of 03/31/2016.     No Known Allergies  Review of Systems  Constitutional:  Positive for malaise/fatigue. Negative for chills and fever.  Respiratory: Positive for cough, sputum production (green) and shortness of breath. Negative for wheezing.   Cardiovascular: Negative for chest pain, palpitations and leg swelling.  Neurological: Positive for weakness and headaches. Negative for dizziness.  Psychiatric/Behavioral: Positive for depression. Negative for hallucinations, memory loss, substance abuse and suicidal ideas. The patient has insomnia (falls asleep easily and then wakes about 4-5 in the morning). The patient is not nervous/anxious.     Objective:  BP 110/78 (BP Location: Right Arm, Patient Position: Sitting, Cuff Size: Normal)   Pulse 64   Temp 97.4 F (36.3 C) (Oral)   Resp 16   Wt 223 lb (101.2 kg)   BMI 29.42 kg/m   Physical Exam  Constitutional: He is well-developed, well-nourished, and in no distress.  HENT:  Head: Normocephalic and atraumatic.  Eyes: Conjunctivae are normal. Pupils are equal, round, and reactive to light.  Neck: Normal range of motion. Neck supple.  Cardiovascular: Normal rate, regular rhythm and normal heart sounds.   Pulmonary/Chest: Effort normal and breath sounds normal.    Assessment and Plan :   1. Depression, unspecified depression type Discussed mood stabilizer with pt and wife and they agree. - ARIPiprazole (ABILIFY) 5 MG tablet; Take 1 tablet (5 mg total) by mouth daily.  Dispense: 30 tablet; Refill: 12    HPI, Exam and A&P Transcribed under the direction and in the presence of Julieanne Mansonichard , Montez HagemanJr., MD. Electronically Signed: Janey GreaserElena DeSanto, RMA I have done the exam and reviewed the chart and it is accurate to the best of my knowledge. DentistDragon  technology has been used and  any errors in dictation or transcription are unintentional. Julieanne Mansonichard  M.D. [facility]

## 2016-04-08 ENCOUNTER — Other Ambulatory Visit: Payer: Self-pay | Admitting: Family Medicine

## 2016-04-08 DIAGNOSIS — F32A Depression, unspecified: Secondary | ICD-10-CM

## 2016-04-08 DIAGNOSIS — F329 Major depressive disorder, single episode, unspecified: Secondary | ICD-10-CM

## 2016-04-28 ENCOUNTER — Ambulatory Visit: Payer: Commercial Managed Care - HMO | Admitting: Family Medicine

## 2016-05-26 ENCOUNTER — Ambulatory Visit (INDEPENDENT_AMBULATORY_CARE_PROVIDER_SITE_OTHER): Payer: Commercial Managed Care - HMO | Admitting: Family Medicine

## 2016-05-26 ENCOUNTER — Encounter: Payer: Self-pay | Admitting: Family Medicine

## 2016-05-26 VITALS — BP 100/66 | HR 68 | Temp 98.2°F | Resp 16 | Wt 227.0 lb

## 2016-05-26 DIAGNOSIS — R5383 Other fatigue: Secondary | ICD-10-CM

## 2016-05-26 DIAGNOSIS — E7800 Pure hypercholesterolemia, unspecified: Secondary | ICD-10-CM | POA: Diagnosis not present

## 2016-05-26 DIAGNOSIS — F32A Depression, unspecified: Secondary | ICD-10-CM

## 2016-05-26 DIAGNOSIS — F329 Major depressive disorder, single episode, unspecified: Secondary | ICD-10-CM | POA: Diagnosis not present

## 2016-05-26 MED ORDER — MELATONIN 10 MG PO CAPS: 1.0000 | ORAL_CAPSULE | Freq: Every day | ORAL | Status: DC

## 2016-05-26 NOTE — Progress Notes (Signed)
Patient: Shane Guerrero Male    DOB: [birth year]   35 y.o.   MRN: [id number] Visit Date: 05/26/2016  Today's Provider: Megan Mans, MD   Chief Complaint  Patient presents with  . Depression    FU   Subjective:    HPI     Depression, Follow-up  He  was last seen for this 2 months ago. Changes made at last visit include adding Abilify.   He reports excellent compliance with treatment. He is having side effects. Hunger.  He reports good tolerance of treatment. Current symptoms include: fatigue and insomnia He feels he is Improved since last visit. Pt states he is 70% improved. Pt states he is now planning on completing tasks on his to do list. Pt is c/o insomnia. He falls asleep easily, but does not stay asleep. Pt wakes up 1-2 times during the night, and wakes up about an hour before he needs to. Pt states he is getting between 6 and 6.5 hours of sleep per night. Pt does not feel rested upon awakening. Pt does snore, but has never been told he has apneic episodes.  Depression screen [facility] 2/9 05/26/2016 02/18/2016 01/20/2016  Decreased Interest Down, Depressed, Hopeless PHQ - 2 Score Altered sleeping Tired, decreased energy Change in appetite 2 0 0  Feeling bad or failure about yourself  0 0 1  Trouble concentrating 2 2 -  Moving slowly or fidgety/restless 0 0 2  Suicidal thoughts 0 0 0  PHQ-9 Score Difficult doing work/chores Somewhat difficult - Very difficult    ------------------------------------------------------------------------    No Known Allergies   Current Outpatient Prescriptions:  .  ARIPiprazole (ABILIFY) 5 MG tablet, Take 1 tablet (5 mg total) by mouth daily., Disp: 30 tablet, Rfl: 12 .  buPROPion (WELLBUTRIN XL) 300 MG 24 hr tablet, Take 1 tablet (300 mg total) by mouth daily., Disp: 30 tablet, Rfl: 11 .  ibuprofen (ADVIL,MOTRIN) 800 MG tablet, Take 1 tablet (800 mg total) by mouth every 8 (eight)  hours as needed., Disp: 30 tablet, Rfl: 3 .  sertraline (ZOLOFT) 50 MG tablet, TAKE 1 TABLET BY MOUTH DAILY, Disp: 30 tablet, Rfl: 11  Review of Systems  Constitutional: Positive for appetite change ("always hungry") and fatigue. Negative for activity change, chills, diaphoresis, fever and unexpected weight change.  HENT: Negative.   Eyes: Negative.   Respiratory: Negative.   Cardiovascular: Negative.   Endocrine: Negative.   Allergic/Immunologic: Negative.   Neurological: Negative.   Hematological: Negative.   Psychiatric/Behavioral: Positive for sleep disturbance. Negative for dysphoric mood and suicidal ideas.    Social History  Substance Use Topics  . Smoking status: Never Smoker  . Smokeless tobacco: Never Used  . Alcohol use 0.0 oz/week     Comment: occasionally   Objective:   BP 100/66 (BP Location: Right Arm, Patient Position: Sitting, Cuff Size: Large)   Pulse 68   Temp 98.2 F (36.8 C) (Oral)   Resp 16   Wt 227 lb (103 kg)   BMI 29.95 kg/m  Vitals:   05/26/16 1034  BP: 100/66  Pulse: 68  Resp: 16  Temp: 98.2 F (36.8 C)  TempSrc: Oral  Weight: 227 lb (103 kg)     Physical Exam  Constitutional: He is oriented to person, place, and time. He appears well-developed and well-nourished.  HENT:  Head: Normocephalic and atraumatic.  Right Ear: External ear normal.  Left Ear: External ear normal.  Nose: Nose normal.  Eyes: Conjunctivae are normal.  Neck: Normal range of motion. Neck supple.  Cardiovascular: Normal rate, regular rhythm and normal heart sounds.   Pulmonary/Chest: Effort normal and breath sounds normal.  Abdominal: Soft.  Neurological: He is alert and oriented to person, place, and time.  Skin: Skin is warm and dry.  Psychiatric: He has a normal mood and affect. His behavior is normal. Judgment and thought content normal.        Assessment & Plan:     1. Depression, unspecified depression type Improving. Pt does not want to make any  medication changes. Encouraged pt to seek counseling. Pt has gaines weight and has increased hunger from the Abilify. Encouraged pt to watch diet. Recheck 2-3 months.Would like to stop Abilify later this year. 2. Fatigue, unspecified type Epworth scale is 9 today. Will check labs. Do not think this is sleep apnea. Advised pt to take Abilify in the mornings, and add OTC Melatonin 10 mg. Recheck 2-3 months. - CBC with Differential/Platelet - Comprehensive metabolic panel - TSH  3. Pure hypercholesterolemia Pt has H/O this. Will check labs. - Lipid panel     Patient seen and examined by Julieanne Manson, MD, and note scribed by Allene Dillon, CMA. I have done the exam and reviewed the above chart and it is accurate to the best of my knowledge. Dentist has been used in this note in any air is in the dictation or transcription are unintentional.  Megan Mans, MD  [facility]

## 2016-05-26 NOTE — Patient Instructions (Addendum)
Try an OTC Melatonin 10 mg for sleep issues. Take this an hour and a half prior to bedtime. Look into the employee assistance program at work for counseling.

## 2016-06-02 DIAGNOSIS — R5383 Other fatigue: Secondary | ICD-10-CM | POA: Diagnosis not present

## 2016-06-02 DIAGNOSIS — E7800 Pure hypercholesterolemia, unspecified: Secondary | ICD-10-CM | POA: Diagnosis not present

## 2016-06-03 LAB — CBC WITH DIFFERENTIAL/PLATELET
BASOS: 1 %
Basophils Absolute: 0.1 10*3/uL (ref 0.0–0.2)
EOS (ABSOLUTE): 0.4 10*3/uL (ref 0.0–0.4)
Eos: 6 %
HEMATOCRIT: 45.1 % (ref 37.5–51.0)
HEMOGLOBIN: 15.2 g/dL (ref 13.0–17.7)
Immature Grans (Abs): 0 10*3/uL (ref 0.0–0.1)
Immature Granulocytes: 1 %
LYMPHS ABS: 2.4 10*3/uL (ref 0.7–3.1)
Lymphs: 38 %
MCH: 27 pg (ref 26.6–33.0)
MCHC: 33.7 g/dL (ref 31.5–35.7)
MCV: 80 fL (ref 79–97)
MONOCYTES: 5 %
MONOS ABS: 0.3 10*3/uL (ref 0.1–0.9)
Neutrophils Absolute: 3.1 10*3/uL (ref 1.4–7.0)
Neutrophils: 49 %
Platelets: 281 10*3/uL (ref 150–379)
RBC: 5.64 x10E6/uL (ref 4.14–5.80)
RDW: 13.4 % (ref 12.3–15.4)
WBC: 6.3 10*3/uL (ref 3.4–10.8)

## 2016-06-03 LAB — COMPREHENSIVE METABOLIC PANEL
ALBUMIN: 4.1 g/dL (ref 3.5–5.5)
ALK PHOS: 136 IU/L — AB (ref 39–117)
ALT: 38 IU/L (ref 0–44)
AST: 25 IU/L (ref 0–40)
Albumin/Globulin Ratio: 1.4 (ref 1.2–2.2)
BILIRUBIN TOTAL: 0.3 mg/dL (ref 0.0–1.2)
BUN/Creatinine Ratio: 11 (ref 9–20)
BUN: 11 mg/dL (ref 6–20)
CHLORIDE: 100 mmol/L (ref 96–106)
CO2: 26 mmol/L (ref 18–29)
Calcium: 9.5 mg/dL (ref 8.7–10.2)
Creatinine, Ser: 1.01 mg/dL (ref 0.76–1.27)
GFR calc Af Amer: 111 mL/min/{1.73_m2} (ref >59)
GFR calc non Af Amer: 96 mL/min/{1.73_m2} (ref >59)
GLOBULIN, TOTAL: 2.9 g/dL (ref 1.5–4.5)
GLUCOSE: 93 mg/dL (ref 65–99)
POTASSIUM: 4.5 mmol/L (ref 3.5–5.2)
SODIUM: 139 mmol/L (ref 134–144)
Total Protein: 7 g/dL (ref 6.0–8.5)

## 2016-06-03 LAB — TSH: TSH: 1.77 u[IU]/mL (ref 0.450–4.500)

## 2016-06-03 LAB — LIPID PANEL
Chol/HDL Ratio: 4.6 ratio (ref 0.0–5.0)
Cholesterol, Total: 251 mg/dL — ABNORMAL HIGH (ref 100–199)
HDL: 54 mg/dL (ref >39)
LDL Calculated: 171 mg/dL — ABNORMAL HIGH (ref 0–99)
Triglycerides: 132 mg/dL (ref 0–149)
VLDL Cholesterol Cal: 26 mg/dL (ref 5–40)

## 2016-07-22 ENCOUNTER — Encounter: Payer: Self-pay | Admitting: Family Medicine

## 2016-07-22 ENCOUNTER — Ambulatory Visit (INDEPENDENT_AMBULATORY_CARE_PROVIDER_SITE_OTHER): Payer: 59 | Admitting: Family Medicine

## 2016-07-22 VITALS — BP 108/72 | HR 64 | Temp 97.5°F | Resp 16 | Wt 233.0 lb

## 2016-07-22 DIAGNOSIS — F331 Major depressive disorder, recurrent, moderate: Secondary | ICD-10-CM

## 2016-07-22 DIAGNOSIS — R5383 Other fatigue: Secondary | ICD-10-CM

## 2016-07-22 MED ORDER — SERTRALINE HCL 100 MG PO TABS: 100.0000 mg | ORAL_TABLET | Freq: Every day | ORAL | 11 refills | Status: DC

## 2016-07-22 NOTE — Progress Notes (Signed)
Patient: Shane Guerrero Male    DOB: Apr 07, 1980   36 y.o.   MRN: [id number] Visit Date: 07/22/2016  Today's Provider: Megan Mans, MD   Chief Complaint  Patient presents with  . Depression   Subjective:    HPI     Depression, Follow-up  He  was last seen for this 2 months ago. Changes made at last visit include continuing medications (abilify in the morning, wellbutrin, zoloft).   He reports good compliance with treatment. He is having side effects. Increased appetite.  He reports good tolerance of treatment. Current symptoms include: depressed mood, fatigue, hypersomnia, impaired memory and weight gain He feels he is Worse since last visit.  Pt is also seeing counselor once weekly for 3 weeks through work. Benefits only allow 6 visits. He states it is too soon to tell if this is beneficial. ------------------------------------------------------------------------ Depression screen [facility] 2/9 07/22/2016 05/26/2016 02/18/2016  Decreased Interest 2 1 1   Down, Depressed, Hopeless 1 1 1   PHQ - 2 Score 3 2 2   Altered sleeping 3 3 1   Tired, decreased energy 2 2 2   Change in appetite 2 2 0  Feeling bad or failure about yourself  0 0 0  Trouble concentrating 2 2 2   Moving slowly or fidgety/restless 2 0 0  Suicidal thoughts 0 0 0  PHQ-9 Score 14 11 7   Difficult doing work/chores Very difficult Somewhat difficult -   GAD 7 : Generalized Anxiety Score 07/22/2016  Nervous, Anxious, on Edge 1  Control/stop worrying 0  Worry too much - different things 0  Trouble relaxing 1  Restless 0  Easily annoyed or irritable 1  Afraid - awful might happen 0  Total GAD 7 Score 3  Anxiety Difficulty Somewhat difficult       No Known Allergies   Current Outpatient Prescriptions:  .  ARIPiprazole (ABILIFY) 5 MG tablet, Take 1 tablet (5 mg total) by mouth daily., Disp: 30 tablet, Rfl: 12 .  aspirin-acetaminophen-caffeine (EXCEDRIN MIGRAINE) 250-250-65 MG tablet, Take 2 tablets  by mouth as needed for headache., Disp: , Rfl:  .  buPROPion (WELLBUTRIN XL) 300 MG 24 hr tablet, Take 1 tablet (300 mg total) by mouth daily., Disp: 30 tablet, Rfl: 11 .  ibuprofen (ADVIL,MOTRIN) 800 MG tablet, Take 1 tablet (800 mg total) by mouth every 8 (eight) hours as needed., Disp: 30 tablet, Rfl: 3 .  Melatonin 10 MG CAPS, Take 1 capsule by mouth at bedtime., Disp: , Rfl:  .  sertraline (ZOLOFT) 50 MG tablet, TAKE 1 TABLET BY MOUTH DAILY, Disp: 30 tablet, Rfl: 11  Review of Systems  Constitutional: Positive for activity change, appetite change, fatigue and unexpected weight change. Negative for chills, diaphoresis and fever.  Respiratory: Negative for shortness of breath.   Cardiovascular: Negative for chest pain, palpitations and leg swelling.  Psychiatric/Behavioral: Positive for dysphoric mood. Negative for sleep disturbance and suicidal ideas. The patient is nervous/anxious.     Social History  Substance Use Topics  . Smoking status: Never Smoker  . Smokeless tobacco: Never Used  . Alcohol use 0.0 oz/week     Comment: occasionally   Objective:   BP 108/72 (BP Location: Right Arm, Patient Position: Sitting, Cuff Size: Large)   Pulse 64   Temp 97.5 F (36.4 C) (Oral)   Resp 16   Wt 233 lb (105.7 kg)   BMI 30.74 kg/m  Vitals:   07/22/16 0805  BP: 108/72  Pulse: 64  Resp:  16  Temp: 97.5 F (36.4 C)  TempSrc: Oral  Weight: 233 lb (105.7 kg)     Physical Exam  Constitutional: He is oriented to person, place, and time. He appears well-developed and well-nourished.  HENT:  Head: Normocephalic and atraumatic.  Eyes: Conjunctivae are normal.  Neck: Normal range of motion. No thyromegaly present.  Cardiovascular: Normal rate, regular rhythm and normal heart sounds.   Pulmonary/Chest: Effort normal and breath sounds normal.  Abdominal: Soft.  Neurological: He is alert and oriented to person, place, and time.  Skin: Skin is warm and dry.  Psychiatric: He has a  normal mood and affect. His behavior is normal. Judgment and thought content normal.        Assessment & Plan:     1. Moderate episode of recurrent major depressive disorder (HCC) Stop Abilify due to hypersomnia and increased appetite. Increase Sertraline to 100 mg po qd. Will refer to psych. FU in 1 month if you do not get the psych appointment before then.Pt not suicidal.Possible dysthymia. - sertraline (ZOLOFT) 100 MG tablet; Take 1 tablet (100 mg total) by mouth daily.  Dispense: 30 tablet; Refill: 11 - Ambulatory referral to Psychiatry  2. Fatigue, unspecified type Most likely due to Abilify. Stop the medication. Epworth checked at LOV and was 9. Call office if sx do not improve.     I have done the exam and reviewed the above chart and it is accurate to the best of my knowledge. DentistDragon  technology has been used in this note in any air is in the dictation or transcription are unintentional.  Megan Mansichard Kelyn Koskela Jr, MD  [facility]

## 2016-07-22 NOTE — Patient Instructions (Signed)
If you can not see psychiatry before one month, call to schedule follow up with Dr. Sullivan LoneGilbert.

## 2016-08-18 ENCOUNTER — Ambulatory Visit: Payer: 59 | Admitting: Licensed Clinical Social Worker

## 2016-08-25 ENCOUNTER — Ambulatory Visit: Payer: 59 | Admitting: Family Medicine

## 2016-09-08 ENCOUNTER — Ambulatory Visit (INDEPENDENT_AMBULATORY_CARE_PROVIDER_SITE_OTHER): Payer: 59 | Admitting: Licensed Clinical Social Worker

## 2016-09-08 DIAGNOSIS — F332 Major depressive disorder, recurrent severe without psychotic features: Secondary | ICD-10-CM | POA: Diagnosis not present
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
## 2016-09-10 ENCOUNTER — Telehealth: Payer: Self-pay

## 2016-09-10 NOTE — Telephone Encounter (Signed)
Spoke with wife Bayley she states that patient after 1 1/2 months got in to psychiatry office [facility] and she said that day it was only like intake information appointment and patient did not see a doctor. His next appointment is in 2 months she said to see a doctor and he really needs to be seen sooner. Can we look into this to see if he can be seen sooner. If not I understand we will address with patient on 09/15/16 when he sees Dr Aldean AstGilbert-aa

## 2016-09-14 NOTE — Telephone Encounter (Signed)
Unable to find anyone that can see pt any sooner.I have called several offices.No sooner or they do not accept insurance

## 2016-09-15 ENCOUNTER — Ambulatory Visit (INDEPENDENT_AMBULATORY_CARE_PROVIDER_SITE_OTHER): Payer: 59 | Admitting: Family Medicine

## 2016-09-15 VITALS — BP 108/72 | HR 64 | Temp 97.9°F | Resp 14 | Wt 230.0 lb

## 2016-09-15 DIAGNOSIS — G479 Sleep disorder, unspecified: Secondary | ICD-10-CM | POA: Diagnosis not present

## 2016-09-15 DIAGNOSIS — F331 Major depressive disorder, recurrent, moderate: Secondary | ICD-10-CM

## 2016-09-15 MED ORDER — AMPHETAMINE-DEXTROAMPHETAMINE 10 MG PO TABS: 10.0000 mg | ORAL_TABLET | Freq: Two times a day (BID) | ORAL | 0 refills | Status: DC

## 2016-09-15 MED ORDER — SERTRALINE HCL 50 MG PO TABS: 150.0000 mg | ORAL_TABLET | Freq: Every day | ORAL | 12 refills | Status: DC

## 2016-09-15 NOTE — Telephone Encounter (Signed)
Will address with patient today when he comes in-aa

## 2016-09-15 NOTE — Progress Notes (Signed)
Shane Guerrero  MRN: [id number] DOB: [birth year]  Subjective:  HPI   The patient is a 36 year old male who presents for follow up on his depression.  He was last seen on 07/22/16 and at that time he was instructed to discontinue his Abilify and increase Sertraline to 100 mg daily.  He was also advised to see and referred to psychiatry.   Patient had PHQ9 on his last visit and at that time he scored 14.   The patient and his wife both report that there has been no change in his depression.  He has not worsened or improved on the new medication course.   Due to his insurance the patient had difficulty finding a psychiatrist that could see him.  He was finally set up with the practice at [facility].  He has had his first visit with them but it was just to collect information.  He will not see the doctor for 2 months.   The patient was also seen for fatigue and narcolepsy.  His wife states he is unable to stay awake when he is at home or anywhere that he sits for a short while.  She reports he gets into such a deep sleep that on one occasion he woke up and asked questions of his wife that were not making sense.    Patient Active Problem List   Diagnosis Date Noted  . Bronchitis 08/08/2015  . Bursitis of knee 04/10/2015  . Clinical depression 04/10/2015  . Adiposity 04/10/2015  . 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 Migraine without aura and responsive to treatment 04/28/2008    No past medical history on file.  Social History   Social History  . Marital status: Married    Spouse name: N/A  . Number of children: N/A  . Years of education: N/A   Occupational History  . Not on file.   Social History Main Topics  . Smoking status: Never Smoker  . Smokeless tobacco: Never Used  . Alcohol use 0.0 oz/week     Comment: occasionally  . Drug use: No  . Sexual activity: Not on file   Other Topics Concern  . Not on file   Social History Narrative  . No narrative on file    Outpatient Encounter  Prescriptions as of 09/15/2016  Medication Sig Note  . aspirin-acetaminophen-caffeine (EXCEDRIN MIGRAINE) 250-250-65 MG tablet Take 2 tablets by mouth as needed for headache.   Marland Kitchen buPROPion (WELLBUTRIN XL) 300 MG 24 hr tablet Take 1 tablet (300 mg total) by mouth daily.   Marland Kitchen ibuprofen (ADVIL,MOTRIN) 800 MG tablet Take 1 tablet (800 mg total) by mouth every 8 (eight) hours as needed. 09/24/2015: prn  . Melatonin 10 MG CAPS Take 1 capsule by mouth at bedtime.   . sertraline (ZOLOFT) 100 MG tablet Take 1 tablet (100 mg total) by mouth daily.    No facility-administered encounter medications on file as of 09/15/2016.     Allergies  Allergen Reactions  . Abilify [Aripiprazole] Other (See Comments)    Excessive hypersomnia and increased appetite   Depression screen [facility] 2/9 07/22/2016 05/26/2016 02/18/2016 01/20/2016 09/24/2015  Decreased Interest 2 1 1 2 2   Down, Depressed, Hopeless 1 1 1 2  0  PHQ - 2 Score 3 2 2 4 2   Altered sleeping 3 3 1 2  0  Tired, decreased energy 2 2 2 3 2   Change in appetite 2 2 0 0 1  Feeling bad or failure about yourself  0 0  0 1 1  Trouble concentrating 2 2 2  - 2  Moving slowly or fidgety/restless 2 0 0 2 1  Suicidal thoughts 0 0 0 0 0  PHQ-9 Score 14 11 7 12 9   Difficult doing work/chores Very difficult Somewhat difficult - Very difficult Somewhat difficult    Review of Systems  Constitutional: Positive for malaise/fatigue. Negative for fever.  Respiratory: Negative for cough, shortness of breath and wheezing.   Cardiovascular: Negative for chest pain, palpitations and orthopnea.  Neurological: Negative for dizziness, weakness and headaches.  Endo/Heme/Allergies: Negative.   Psychiatric/Behavioral: Positive for depression and memory loss. Negative for hallucinations, substance abuse and suicidal ideas. The patient is nervous/anxious. The patient does not have insomnia.     Objective:  BP 108/72 (BP Location: Right Arm, Patient Position: Sitting, Cuff Size: Normal)    Pulse 64   Temp 97.9 F (36.6 C) (Oral)   Resp 14   Wt 230 lb (104.3 kg)   BMI 30.34 kg/m   Physical Exam  Constitutional: He is well-developed, well-nourished, and in no distress.  HENT:  Head: Normocephalic and atraumatic.  Eyes: Pupils are equal, round, and reactive to light. Conjunctivae are normal.  Neck: Normal range of motion.  Cardiovascular: Normal rate, regular rhythm and normal heart sounds.   Pulmonary/Chest: Effort normal and breath sounds normal.  Psychiatric: Mood, memory, affect and judgment normal.    Assessment and Plan :   1. Moderate episode of recurrent major depressive disorder (HCC) Has appt with psychiatry. - sertraline (ZOLOFT) 50 MG tablet; Take 3 tablets (150 mg total) by mouth daily.  Dispense: 90 tablet; Refill: 12 - amphetamine-dextroamphetamine (ADDERALL) 10 MG tablet; Take 1 tablet (10 mg total) by mouth 2 (two) times daily with a meal.  Dispense: 60 tablet; Refill: 0  2. Sleep disorder  - Ambulatory referral to Neurology  HPI, Exam and A&P Transcribed under the direction and in the presence of Megan Mansichard Gilbert, Jr., MD. Electronically Signed: Janey GreaserElena DeSanto, RMA

## 2016-09-23 DIAGNOSIS — E559 Vitamin D deficiency, unspecified: Secondary | ICD-10-CM | POA: Diagnosis not present

## 2016-09-23 DIAGNOSIS — E538 Deficiency of other specified B group vitamins: Secondary | ICD-10-CM | POA: Diagnosis not present

## 2016-09-23 DIAGNOSIS — R0683 Snoring: Secondary | ICD-10-CM | POA: Diagnosis not present

## 2016-09-30 ENCOUNTER — Telehealth: Payer: Self-pay | Admitting: Family Medicine

## 2016-09-30 NOTE — Telephone Encounter (Signed)
Error/MW °

## 2016-10-01 DIAGNOSIS — E538 Deficiency of other specified B group vitamins: Secondary | ICD-10-CM | POA: Diagnosis not present

## 2016-10-01 DIAGNOSIS — R0683 Snoring: Secondary | ICD-10-CM | POA: Diagnosis not present

## 2016-10-01 DIAGNOSIS — E559 Vitamin D deficiency, unspecified: Secondary | ICD-10-CM | POA: Diagnosis not present

## 2016-10-14 ENCOUNTER — Ambulatory Visit (INDEPENDENT_AMBULATORY_CARE_PROVIDER_SITE_OTHER): Payer: 59 | Admitting: Family Medicine

## 2016-10-14 ENCOUNTER — Encounter: Payer: Self-pay | Admitting: Family Medicine

## 2016-10-14 VITALS — BP 112/60 | HR 78 | Temp 97.8°F | Resp 16 | Wt 228.0 lb

## 2016-10-14 DIAGNOSIS — F32A Depression, unspecified: Secondary | ICD-10-CM

## 2016-10-14 DIAGNOSIS — F329 Major depressive disorder, single episode, unspecified: Secondary | ICD-10-CM | POA: Diagnosis not present

## 2016-10-14 DIAGNOSIS — L237 Allergic contact dermatitis due to plants, except food: Secondary | ICD-10-CM

## 2016-10-14 DIAGNOSIS — F331 Major depressive disorder, recurrent, moderate: Secondary | ICD-10-CM | POA: Diagnosis not present

## 2016-10-14 DIAGNOSIS — S60552A Superficial foreign body of left hand, initial encounter: Secondary | ICD-10-CM

## 2016-10-14 DIAGNOSIS — G479 Sleep disorder, unspecified: Secondary | ICD-10-CM

## 2016-10-14 MED ORDER — AMPHETAMINE-DEXTROAMPHETAMINE 10 MG PO TABS: 10.0000 mg | ORAL_TABLET | Freq: Two times a day (BID) | ORAL | 0 refills | Status: DC

## 2016-10-14 MED ORDER — PREDNISONE 10 MG (48) PO TBPK: ORAL_TABLET | ORAL | 0 refills | Status: DC

## 2016-10-14 NOTE — Progress Notes (Signed)
Patient: Shane Guerrero Male    DOB: [birth year]   36 y.o.   MRN: [id number] Visit Date: 10/14/2016  Today's Provider: Megan Mansichard Gilbert Jr, MD   Chief Complaint  Patient presents with  . Depression  . Sleeping Problem   Subjective:    HPI Pt is here for a follow up of depression and sleep disorder. He has seen by the neurologist and they ordered an at home sleep study. He reports that he feels a little better but wife says he is about the same. Pt says that he is not falling asleep like he was. He has still not seen the psychiatrist but has an appt on October 1st.   Pt would also like his hand looked at. Wife says he seems like he has something in his hand but pt can not stop messing with it and she thinks it is infected. Also he has a rash or poison oak on both of his legs.   Also he would like a refill on his Adderall.      Allergies  Allergen Reactions  . Abilify [Aripiprazole] Other (See Comments)    Excessive hypersomnia and increased appetite     Current Outpatient Prescriptions:  .  amphetamine-dextroamphetamine (ADDERALL) 10 MG tablet, Take 1 tablet (10 mg total) by mouth 2 (two) times daily with a meal., Disp: 60 tablet, Rfl: 0 .  aspirin-acetaminophen-caffeine (EXCEDRIN MIGRAINE) 250-250-65 MG tablet, Take 2 tablets by mouth as needed for headache., Disp: , Rfl:  .  buPROPion (WELLBUTRIN XL) 300 MG 24 hr tablet, Take 1 tablet (300 mg total) by mouth daily., Disp: 30 tablet, Rfl: 11 .  ibuprofen (ADVIL,MOTRIN) 800 MG tablet, Take 1 tablet (800 mg total) by mouth every 8 (eight) hours as needed., Disp: 30 tablet, Rfl: 3 .  sertraline (ZOLOFT) 50 MG tablet, Take 3 tablets (150 mg total) by mouth daily., Disp: 90 tablet, Rfl: 12 .  Melatonin 10 MG CAPS, Take 1 capsule by mouth at bedtime. (Patient not taking: Reported on 10/14/2016), Disp: , Rfl:   Review of Systems  Constitutional: Negative.   HENT: Negative.   Eyes: Negative.   Respiratory: Negative.     Cardiovascular: Negative.   Gastrointestinal: Negative.   Endocrine: Negative.   Genitourinary: Negative.   Musculoskeletal: Negative.   Skin: Positive for rash and wound.  Allergic/Immunologic: Negative.   Neurological: Negative.   Hematological: Negative.   Psychiatric/Behavioral: Negative.     Social History  Substance Use Topics  . Smoking status: Never Smoker  . Smokeless tobacco: Never Used  . Alcohol use 0.0 oz/week     Comment: occasionally   Objective:   BP 112/60 (BP Location: Left Arm, Patient Position: Sitting, Cuff Size: Large)   Pulse 78   Temp 97.8 F (36.6 C) (Oral)   Resp 16   Wt 228 lb (103.4 kg)   BMI 30.08 kg/m  Vitals:   10/14/16 1110  BP: 112/60  Pulse: 78  Resp: 16  Temp: 97.8 F (36.6 C)  TempSrc: Oral  Weight: 228 lb (103.4 kg)     Physical Exam  Constitutional: He is oriented to person, place, and time. He appears well-developed and well-nourished.  Eyes: Pupils are equal, round, and reactive to light. Conjunctivae and EOM are normal.  Neck: Normal range of motion. Neck supple.  Cardiovascular: Normal rate, regular rhythm, normal heart sounds and intact distal pulses.   Pulmonary/Chest: Effort normal and breath sounds normal.  Musculoskeletal: Normal range of motion.  Neurological: He is alert and oriented to person, place, and time. He has normal reflexes.  Skin: Skin is warm and dry.  Psychiatric: He has a normal mood and affect. His behavior is normal. Judgment and thought content normal.        Assessment & Plan:     1. Moderate episode of recurrent major depressive disorder (HCC)  - amphetamine-dextroamphetamine (ADDERALL) 10 MG tablet; Take 1 tablet (10 mg total) by mouth 2 (two) times daily with a meal.  Dispense: 60 tablet; Refill: 0  2. Poison oak  - predniSONE (STERAPRED UNI-PAK 48 TAB) 10 MG (48) TBPK tablet; Taper as directed.  Dispense: 48 tablet; Refill: 0  3. Foreign body of left hand, initial encounter  - DG  Hand Complete Left; Future  4. Depression, unspecified depression type Pt is scheduled to see Psychiatry. Follow up in 3 months.   5. Sleep disorder Followed by Dr. Sherryll Burger. Pt is having sleep study. Follow up in 3 months     HPI, Exam, and A&P Transcribed under the direction and in the presence of Richard L. Wendelyn Breslow, MD  Electronically Signed: Silvio Pate, CMA  I have done the exam and reviewed the above chart and it is accurate to the best of my knowledge. Dentist has been used in this note in any air is in the dictation or transcription are unintentional.  Megan Mans, MD  [facility]

## 2016-10-15 ENCOUNTER — Institutional Professional Consult (permissible substitution): Payer: Self-pay | Admitting: Neurology

## 2016-10-18 DIAGNOSIS — G4733 Obstructive sleep apnea (adult) (pediatric): Secondary | ICD-10-CM | POA: Diagnosis not present

## 2016-10-20 ENCOUNTER — Institutional Professional Consult (permissible substitution): Payer: Self-pay | Admitting: Neurology

## 2016-10-30 ENCOUNTER — Ambulatory Visit (HOSPITAL_COMMUNITY): Payer: Self-pay | Admitting: Psychiatry

## 2016-11-17 DIAGNOSIS — Z79899 Other long term (current) drug therapy: Secondary | ICD-10-CM | POA: Diagnosis not present

## 2016-11-18 ENCOUNTER — Encounter: Payer: Self-pay | Admitting: Family Medicine

## 2016-11-19 ENCOUNTER — Ambulatory Visit: Payer: Self-pay

## 2017-01-12 ENCOUNTER — Encounter: Payer: Self-pay | Admitting: Family Medicine

## 2017-01-12 ENCOUNTER — Ambulatory Visit: Payer: Self-pay | Admitting: Family Medicine

## 2017-02-16 ENCOUNTER — Ambulatory Visit
Admission: RE | Admit: 2017-02-16 | Discharge: 2017-02-16 | Disposition: A | Discharge status: Home / Self Care | Payer: 59 | Source: Ambulatory Visit | Attending: Family Medicine | Admitting: Family Medicine

## 2017-02-16 ENCOUNTER — Ambulatory Visit: Payer: 59 | Admitting: Family Medicine

## 2017-02-16 ENCOUNTER — Encounter: Payer: Self-pay | Admitting: Family Medicine

## 2017-02-16 VITALS — BP 118/80 | HR 98 | Temp 97.8°F | Wt 228.4 lb

## 2017-02-16 DIAGNOSIS — M25562 Pain in left knee: Secondary | ICD-10-CM | POA: Diagnosis not present

## 2017-02-16 DIAGNOSIS — G8929 Other chronic pain: Secondary | ICD-10-CM

## 2017-02-16 DIAGNOSIS — M25561 Pain in right knee: Secondary | ICD-10-CM

## 2017-02-16 DIAGNOSIS — Z114 Encounter for screening for human immunodeficiency virus [HIV]: Secondary | ICD-10-CM | POA: Diagnosis not present

## 2017-02-16 NOTE — Progress Notes (Signed)
Patient: Shane Guerrero Male    DOB: [birth year]   37 y.o.   MRN: [id number] Visit Date: 02/16/2017  Today's Provider: Dortha Kern, PA   Chief Complaint  Patient presents with  . Knee Pain   Subjective:    Knee Pain   Incident onset: couple weeks ago. There was no injury mechanism. The pain is present in the left knee and right knee. The quality of the pain is described as aching. The pain has been fluctuating since onset. Associated symptoms include numbness. Exacerbated by: climbing stairs, lifting, and weight bearing to stand from sitting position. He has tried nothing for the symptoms.   Patient Active Problem List   Diagnosis Date Noted  . Bronchitis 08/08/2015  . Bursitis of knee 04/10/2015  . Clinical depression 04/10/2015  . Adiposity 04/10/2015  . 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 Migraine without aura and responsive to treatment 04/28/2008   Past Surgical History:  Procedure Laterality Date  . SEPTOPLASTY     Family History  Problem Relation Age of Onset  . Healthy Mother   . Hyperlipidemia Father   . Healthy Brother   . Sleep apnea Brother   . Heart disease Maternal Grandfather   . Lung cancer Maternal Grandfather    Allergies  Allergen Reactions  . Abilify [Aripiprazole] Other (See Comments)    Excessive hypersomnia and increased appetite    Current Outpatient Medications:  .  amphetamine-dextroamphetamine (ADDERALL) 10 MG tablet, Take 1 tablet (10 mg total) by mouth 2 (two) times daily with a meal., Disp: 60 tablet, Rfl: 0 .  aspirin-acetaminophen-caffeine (EXCEDRIN MIGRAINE) 250-250-65 MG tablet, Take 2 tablets by mouth as needed for headache., Disp: , Rfl:  .  buPROPion (WELLBUTRIN XL) 300 MG 24 hr tablet, Take 1 tablet (300 mg total) by mouth daily., Disp: 30 tablet, Rfl: 11 .  ibuprofen (ADVIL,MOTRIN) 800 MG tablet, Take 1 tablet (800 mg total) by mouth every 8 (eight) hours as needed., Disp: 30 tablet, Rfl: 3 .  sertraline (ZOLOFT) 50 MG tablet,  Take 3 tablets (150 mg total) by mouth daily., Disp: 90 tablet, Rfl: 12  Review of Systems  Constitutional: Negative.   Respiratory: Negative.   Cardiovascular: Negative.   Musculoskeletal:       Bilateral knee pain   Neurological: Positive for numbness.   Social History   Tobacco Use  . Smoking status: Never Smoker  . Smokeless tobacco: Never Used  Substance Use Topics  . Alcohol use: Yes    Alcohol/week: 0.0 oz    Comment: occasionally   Objective:   BP 118/80 (BP Location: Right Arm, Patient Position: Sitting, Cuff Size: Normal)   Pulse 98   Temp 97.8 F (36.6 C) (Oral)   Wt 228 lb 6.4 oz (103.6 kg)   SpO2 98%   BMI 30.13 kg/m   Physical Exam  Constitutional: He is oriented to person, place, and time. He appears well-developed and well-nourished. No distress.  HENT:  Head: Normocephalic and atraumatic.  Right Ear: Hearing normal.  Left Ear: Hearing normal.  Nose: Nose normal.  Eyes: Conjunctivae and lids are normal. Right eye exhibits no discharge. Left eye exhibits no discharge. No scleral icterus.  Pulmonary/Chest: Effort normal. No respiratory distress.  Musculoskeletal: Normal range of motion. He exhibits no edema or tenderness.  Some crepitus in knees to check ROM (R>L). No swelling or locking. Good pulses.  Neurological: He is alert and oriented to person, place, and time.  Skin: Skin is intact. No  lesion and no rash noted.  Psychiatric: He has a normal mood and affect. His speech is normal and behavior is normal. Thought content normal.      Assessment & Plan:     1. Chronic pain of both knees Onset over the [past 2 week after playing with daughter and sliding on a wood floor. Felt almost immediate pain this time. 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 Some improvement with use of Ibuprofen for a short time. Will check labs and x-rays. Follow up pending reports. -  CBC with Differential/Platelet - Sedimentation rate - Uric acid - Rheumatoid Arthritis Profile - DG Knee Complete 4 Views Right - DG Knee Complete 4 Views Left  2. Screening for HIV (human immunodeficiency virus) - HIV antibody       Dortha Kernennis Hayle Parisi, PA  [facility]

## 2017-02-17 ENCOUNTER — Telehealth: Payer: Self-pay

## 2017-02-17 NOTE — Telephone Encounter (Signed)
Patient advised as directed below.  Thanks,  -Jamesyn Moorefield 

## 2017-02-17 NOTE — Telephone Encounter (Signed)
-----   Message from Tamsen Roersennis E Chrismon, [place] sent at 02/16/2017  5:26 PM EST ----- Minimal degenerative changes of the left knee. Right knee normal. Awaiting lab reports.

## 2017-02-18 ENCOUNTER — Telehealth: Payer: Self-pay

## 2017-02-18 MED ORDER — PREDNISONE 5 MG PO TABS: ORAL_TABLET | ORAL | 0 refills | Status: DC

## 2017-02-18 NOTE — Telephone Encounter (Signed)
-----   Message from Jodell Ciproennis E Watts[place]ismon, [place] sent at 02/18/2017  9:45 AM EST ----- Partial lab reports indicates increase in inflammation indicator, no sign of gout or infection but rheumatoid arthritis test borderline. Recommend prednisone (Sterapred 5 mg 6 day Unipak #21 tablets to taper as directed on package). Schedule recheck in 1 week if no better.

## 2017-02-18 NOTE — Telephone Encounter (Signed)
Patient advised. RX sent to [place] [facility].  

## 2017-02-20 LAB — CBC WITH DIFFERENTIAL/PLATELET
BASOS ABS: 0 10*3/uL (ref 0.0–0.2)
Basos: 1 %
EOS (ABSOLUTE): 0.3 10*3/uL (ref 0.0–0.4)
EOS: 4 %
Hematocrit: 45.3 % (ref 37.5–51.0)
Hemoglobin: 15.2 g/dL (ref 13.0–17.7)
IMMATURE GRANULOCYTES: 0 %
Immature Grans (Abs): 0 10*3/uL (ref 0.0–0.1)
LYMPHS ABS: 1.8 10*3/uL (ref 0.7–3.1)
Lymphs: 29 %
MCH: 27 pg (ref 26.6–33.0)
MCHC: 33.6 g/dL (ref 31.5–35.7)
MCV: 81 fL (ref 79–97)
MONOCYTES: 6 %
MONOS ABS: 0.4 10*3/uL (ref 0.1–0.9)
NEUTROS PCT: 60 %
Neutrophils Absolute: 3.8 10*3/uL (ref 1.4–7.0)
Platelets: 295 10*3/uL (ref 150–379)
RBC: 5.62 x10E6/uL (ref 4.14–5.80)
RDW: 13.7 % (ref 12.3–15.4)
WBC: 6.2 10*3/uL (ref 3.4–10.8)

## 2017-02-20 LAB — HIV 1/2 AB DIFFERENTIATION
HIV 1 AB: NEGATIVE
HIV 2 Ab: NEGATIVE
NOTE (HIV CONF MULTIP: NEGATIVE

## 2017-02-20 LAB — RNA QUALITATIVE: HIV 1 RNA Qualitative: NEGATIVE

## 2017-02-20 LAB — RHEUMATOID ARTHRITIS PROFILE
Cyclic Citrullin Peptide Ab: 11 units (ref 0–19)
RHEUMATOID FACTOR: 15.5 [IU]/mL — AB (ref 0.0–13.9)

## 2017-02-20 LAB — HIV ANTIBODY (ROUTINE TESTING W REFLEX): HIV SCREEN 4TH GENERATION: REACTIVE — AB

## 2017-02-20 LAB — URIC ACID: URIC ACID: 5.7 mg/dL (ref 3.7–8.6)

## 2017-02-20 LAB — SEDIMENTATION RATE: SED RATE: 29 mm/h — AB (ref 0–15)

## 2017-02-22 ENCOUNTER — Telehealth: Payer: Self-pay

## 2017-02-22 ENCOUNTER — Telehealth: Payer: Self-pay | Admitting: Infectious Disease

## 2017-02-22 NOTE — Telephone Encounter (Signed)
-----   Message from Tamsen Roersennis E Chrismon, [place] sent at 02/22/2017  8:09 AM EST ----- HIV test was reactive and patient needs referral to infectious disease specialist to determine if this is a false reaction or a sign of need for antiviral medications. Please schedule.

## 2017-02-22 NOTE — Telephone Encounter (Signed)
Maurine MinisterDennis attempted to contact patient. No answer or voicemail.

## 2017-02-22 NOTE — Telephone Encounter (Signed)
This patient had a false + HIV EIA, negative HIV test by final step with HIV qual  DO you think he would be a good candidate to be on Pre Exposure Prophylaxis for HIV (PrEP) with Truvada?  If so we would be happy to see him in our pharmacy clinic or if you felt comfortable rx it yourself that would also be great  I am cc our ID pharmacists

## 2017-02-23 NOTE — Progress Notes (Signed)
I contacted the patient and he is seeing a little improvement with use of the prednisone. He will schedule a follow up appointment to reassess joints and consider further referrals or testing.

## 2017-02-24 NOTE — Telephone Encounter (Signed)
I need a quick OV with pt without his wife present to discuss this issue.

## 2017-02-24 NOTE — Telephone Encounter (Signed)
Shane Guerrero, this patient is on your schedule for tomorrow morning.  Also there is a message in his chart from another facility about having the false positive result.  I did not call him since he is seeing you tomorrow. Thanks Northwest AirlinesElena

## 2017-02-24 NOTE — Telephone Encounter (Signed)
Patient has an appt with Maurine Ministerennis tomorrow. Dr. Sullivan LoneGilbert will inform Maurine MinisterDennis about the patient before he is seen.

## 2017-02-25 ENCOUNTER — Encounter: Payer: Self-pay | Admitting: Family Medicine

## 2017-02-25 ENCOUNTER — Ambulatory Visit: Payer: 59 | Admitting: Family Medicine

## 2017-02-25 VITALS — BP 128/84 | HR 80 | Temp 98.4°F | Wt 230.6 lb

## 2017-02-25 DIAGNOSIS — Z789 Other specified health status: Secondary | ICD-10-CM

## 2017-02-25 DIAGNOSIS — M25561 Pain in right knee: Secondary | ICD-10-CM

## 2017-02-25 DIAGNOSIS — M25562 Pain in left knee: Secondary | ICD-10-CM | POA: Diagnosis not present

## 2017-02-25 DIAGNOSIS — G8929 Other chronic pain: Secondary | ICD-10-CM

## 2017-02-25 MED ORDER — IBUPROFEN 800 MG PO TABS
800.0000 mg | ORAL_TABLET | Freq: Three times a day (TID) | ORAL | 3 refills | Status: DC | PRN
Start: 2017-02-25 — End: 2019-05-22

## 2017-02-25 NOTE — Progress Notes (Signed)
Patient: Shane Guerrero Male    DOB: 08-08-1980   37 y.o.   MRN: [id number] Visit Date: 02/25/2017  Today's Provider: Dortha Kern, PA   Chief Complaint  Patient presents with  . Knee Pain  . lab results  . Follow-up   Subjective:    HPI Chronic pain of both knees:  Patient presents today for a follow up. Last OV was on 02/16/17. Labs done on 02/16/17 indicated a increase in inflammation indicator, no sign of gout or infection but rheumatoid arthritis test was borderline. Recommend prednisone 5 mg taper dose and advised patient to follow up if no better in 1 week. Patient reports good compliance with treatment plan. He reports symptoms have slightly improved, but he states his legs feel like "lead" now after taking Prednisone.     History reviewed. No pertinent past medical history. Patient Active Problem List   Diagnosis Date Noted  . Bronchitis 08/08/2015  . Bursitis of knee 04/10/2015  . Clinical depression 04/10/2015  . Adiposity 04/10/2015  . 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 Migraine without aura and responsive to treatment 04/28/2008   Past Surgical History:  Procedure Laterality Date  . SEPTOPLASTY     Allergies  Allergen Reactions  . Abilify [Aripiprazole] Other (See Comments)    Excessive hypersomnia and increased appetite    Current Outpatient Medications:  .  amphetamine-dextroamphetamine (ADDERALL) 10 MG tablet, Take 1 tablet (10 mg total) by mouth 2 (two) times daily with a meal., Disp: 60 tablet, Rfl: 0 .  aspirin-acetaminophen-caffeine (EXCEDRIN MIGRAINE) 250-250-65 MG tablet, Take 2 tablets by mouth as needed for headache., Disp: , Rfl:  .  buPROPion (WELLBUTRIN XL) 300 MG 24 hr tablet, Take 1 tablet (300 mg total) by mouth daily., Disp: 30 tablet, Rfl: 11 .  ibuprofen (ADVIL,MOTRIN) 800 MG tablet, Take 1 tablet (800 mg total) by mouth every 8 (eight) hours as needed., Disp: 30 tablet, Rfl: 3 .  predniSONE (DELTASONE) 5 MG tablet, 6 day taper dose. Take 6  tablets, then 5,4,3,2,1, Disp: 21 tablet, Rfl: 0 .  sertraline (ZOLOFT) 50 MG tablet, Take 3 tablets (150 mg total) by mouth daily., Disp: 90 tablet, Rfl: 12  Review of Systems  Constitutional: Negative.   Respiratory: Negative.   Cardiovascular: Negative.   Musculoskeletal:       Bilateral knee pain     Social History   Tobacco Use  . Smoking status: Never Smoker  . Smokeless tobacco: Never Used  Substance Use Topics  . Alcohol use: Yes    Alcohol/week: 0.0 oz    Comment: occasionally   Objective:   BP 128/84 (BP Location: Right Arm, Patient Position: Sitting, Cuff Size: Normal)   Pulse 80   Temp 98.4 F (36.9 C) (Oral)   Wt 230 lb 9.6 oz (104.6 kg)   SpO2 96%   BMI 30.42 kg/m    Physical Exam  Constitutional: He is oriented to person, place, and time. He appears well-developed and well-nourished. No distress.  HENT:  Head: Normocephalic and atraumatic.  Right Ear: Hearing normal.  Left Ear: Hearing normal.  Nose: Nose normal.  Eyes: Conjunctivae and lids are normal. Right eye exhibits no discharge. Left eye exhibits no discharge. No scleral icterus.  Neck: Neck supple.  Cardiovascular: Normal rate and regular rhythm.  Pulmonary/Chest: Effort normal and breath sounds normal. No respiratory distress.  Abdominal: Soft.  Musculoskeletal: Normal range of motion.  Neurological: He is alert and oriented to person, place, and time.  Skin: Skin  is intact. No lesion and no rash noted.  Psychiatric: He has a normal mood and affect. His speech is normal and behavior is normal. Thought content normal.      Assessment & Plan:     1. Chronic pain of both knees Slightly positive RA factor and slightly elevated sedrate on 02-16-17. Prednisone taper has helped decrease knee pains and increase mobility. Wants to go back to using Ibuprofen intermittently before referral to the rheumatologist. - ibuprofen (ADVIL,MOTRIN) 800 MG tablet; Take 1 tablet (800 mg total) by mouth every 8  (eight) hours as needed.  Dispense: 30 tablet; Refill: 3  2. False positive serology for HIV Had a positive HIV screening test with negative HIV1 AB, HIV2 AB and RNA tests. Denies past history of paying for sex, homosexual exposure, IV drug use, blood transfusions or exposure to blood/body fluids. No problems with frequent infections or FUO's. Will follow up in a year.        Dortha Kernennis Chrismon, PA  [facility]

## 2017-03-09 ENCOUNTER — Encounter: Payer: Self-pay | Admitting: Family Medicine

## 2017-03-09 ENCOUNTER — Ambulatory Visit: Payer: 59 | Admitting: Family Medicine

## 2017-03-09 VITALS — BP 120/76 | HR 70 | Temp 97.8°F | Resp 14 | Wt 226.0 lb

## 2017-03-09 DIAGNOSIS — G8929 Other chronic pain: Secondary | ICD-10-CM | POA: Diagnosis not present

## 2017-03-09 DIAGNOSIS — Z23 Encounter for immunization: Secondary | ICD-10-CM

## 2017-03-09 DIAGNOSIS — M25562 Pain in left knee: Secondary | ICD-10-CM

## 2017-03-09 DIAGNOSIS — F331 Major depressive disorder, recurrent, moderate: Secondary | ICD-10-CM | POA: Diagnosis not present

## 2017-03-09 DIAGNOSIS — M25561 Pain in right knee: Secondary | ICD-10-CM | POA: Diagnosis not present

## 2017-03-09 DIAGNOSIS — Z789 Other specified health status: Secondary | ICD-10-CM

## 2017-03-09 MED ORDER — NAPROXEN 500 MG PO TBEC: 500.0000 mg | DELAYED_RELEASE_TABLET | Freq: Two times a day (BID) | ORAL | 11 refills | Status: DC

## 2017-03-09 NOTE — Progress Notes (Signed)
Patient: Shane Guerrero Male    DOB: [birth year]   37 y.o.   MRN: [id number] Visit Date: 03/09/2017  Today's Provider: Megan Mansichard Gilbert Jr, MD   Chief Complaint  Patient presents with  . Knee Pain   Subjective:    HPI Pt is here for a follow up of bilateral knee pain, right worse than left. He was treated with prednisone and it helped and then a few days after finishing the prednisone it started back and not he is taking ibuprofen every 8 hours and tylenol in between. He reports that it is better today but  He is off work today also.  They also want to hear it from Dr. Sullivan LoneGilbert that the false positive HIV was in fact a false positive.       Allergies  Allergen Reactions  . Abilify [Aripiprazole] Other (See Comments)    Excessive hypersomnia and increased appetite     Current Outpatient Medications:  .  amphetamine-dextroamphetamine (ADDERALL) 15 MG tablet, , Disp: , Rfl:  .  aspirin-acetaminophen-caffeine (EXCEDRIN MIGRAINE) 250-250-65 MG tablet, Take 2 tablets by mouth as needed for headache., Disp: , Rfl:  .  buPROPion (WELLBUTRIN XL) 300 MG 24 hr tablet, Take 1 tablet (300 mg total) by mouth daily., Disp: 30 tablet, Rfl: 11 .  Escitalopram Oxalate (LEXAPRO PO), Take by mouth. Unknown dose, Disp: , Rfl:  .  ibuprofen (ADVIL,MOTRIN) 800 MG tablet, Take 1 tablet (800 mg total) by mouth every 8 (eight) hours as needed., Disp: 30 tablet, Rfl: 3 .  predniSONE (DELTASONE) 5 MG tablet, 6 day taper dose. Take 6 tablets, then 5,4,3,2,1 (Patient not taking: Reported on 03/09/2017), Disp: 21 tablet, Rfl: 0 .  sertraline (ZOLOFT) 50 MG tablet, Take 3 tablets (150 mg total) by mouth daily. (Patient not taking: Reported on 03/09/2017), Disp: 90 tablet, Rfl: 12  Review of Systems  Constitutional: Negative.   HENT: Negative.   Eyes: Negative.   Respiratory: Negative.   Cardiovascular: Negative.   Gastrointestinal: Negative.   Endocrine: Negative.   Genitourinary: Negative.     Musculoskeletal: Positive for arthralgias.  Skin: Negative.   Allergic/Immunologic: Negative.   Neurological: Negative.   Hematological: Negative.   Psychiatric/Behavioral: Negative.     Social History   Tobacco Use  . Smoking status: Never Smoker  . Smokeless tobacco: Never Used  Substance Use Topics  . Alcohol use: Yes    Alcohol/week: 0.0 oz    Comment: occasionally   Objective:   BP 120/76 (BP Location: Left Arm, Patient Position: Sitting, Cuff Size: Large)   Pulse 70   Temp 97.8 F (36.6 C) (Oral)   Resp 14   Wt 226 lb (102.5 kg)   BMI 29.82 kg/m  Vitals:   03/09/17 1559  BP: 120/76  Pulse: 70  Resp: 14  Temp: 97.8 F (36.6 C)  TempSrc: Oral  Weight: 226 lb (102.5 kg)     Physical Exam  Constitutional: He is oriented to person, place, and time. He appears well-developed and well-nourished.  Eyes: Conjunctivae and EOM are normal. Pupils are equal, round, and reactive to light.  Cardiovascular: Normal rate, regular rhythm, normal heart sounds and intact distal pulses.  Pulmonary/Chest: Effort normal and breath sounds normal.  Musculoskeletal: Normal range of motion.  Neurological: He is alert and oriented to person, place, and time. He has normal reflexes.  Skin: Skin is warm and dry.  Psychiatric: He has a normal mood and affect. His behavior is normal. Judgment and  thought content normal.        Assessment & Plan:     1. Need for influenza vaccination  - Flu Vaccine QUAD 36+ mos IM  2. False positive serology for HIV Recheck 6 months from original check.  He/wife deny high risk lifestyle. 3. Chronic pain of both knees Treat with naproxen follow up 3 months - naproxen (EC NAPROSYN) 500 MG EC tablet; Take 1 tablet (500 mg total) by mouth 2 (two) times daily with a meal.  Dispense: 60 tablet; Refill: 11      HPI, Exam, and A&P Transcribed under the direction and in the presence of Richard L. Wendelyn Breslow, MD  Electronically Signed: Silvio Pate, CMA  I have done the exam and reviewed the above chart and it is accurate to the best of my knowledge. Dentist has been used in this note in any air is in the dictation or transcription are unintentional.  Megan Mans, MD  [facility]

## 2017-06-11 ENCOUNTER — Encounter: Payer: Self-pay | Admitting: Family Medicine

## 2017-06-16 ENCOUNTER — Encounter: Payer: Self-pay | Admitting: Family Medicine

## 2017-06-22 ENCOUNTER — Ambulatory Visit: Payer: 59 | Admitting: Family Medicine

## 2017-06-22 ENCOUNTER — Encounter: Payer: Self-pay | Admitting: Family Medicine

## 2017-06-22 VITALS — BP 126/78 | Temp 98.0°F | Resp 16 | Ht 73.0 in | Wt 234.0 lb

## 2017-06-22 DIAGNOSIS — M25562 Pain in left knee: Secondary | ICD-10-CM

## 2017-06-22 DIAGNOSIS — M25561 Pain in right knee: Secondary | ICD-10-CM

## 2017-06-22 DIAGNOSIS — G8929 Other chronic pain: Secondary | ICD-10-CM | POA: Diagnosis not present

## 2017-06-22 MED ORDER — PREDNISONE 10 MG (21) PO TBPK: ORAL_TABLET | ORAL | 0 refills | Status: DC

## 2017-06-22 NOTE — Patient Instructions (Signed)
Start over the counter Tumeric 1 tablet daily.

## 2017-06-22 NOTE — Progress Notes (Signed)
Patient: Shane Guerrero Male    DOB: [birth year]   37 y.o.   MRN: [id number] Visit Date: 06/22/2017  Today's Provider: Megan Mans, MD   Chief Complaint  Patient presents with  . Knee Pain    chronic    Subjective:    HPI Patient comes in today for a follow up. He was last seen in the office 3 months ago due to chronic knee pain. He was treated with naproxen  BID. He reports that the Naproxen has helped a little, but he still has pain.     He also mentions that since the last time he was seen his psychiatrist has started him on gabapentin  QHS to help with his sleep.   Allergies  Allergen Reactions  . Abilify [Aripiprazole] Other (See Comments)    Excessive hypersomnia and increased appetite     Current Outpatient Medications:  .  amphetamine-dextroamphetamine (ADDERALL) 15 MG tablet, , Disp: , Rfl:  .  aspirin-acetaminophen-caffeine (EXCEDRIN MIGRAINE) 250-250-65 MG tablet, Take 2 tablets by mouth as needed for headache., Disp: , Rfl:  .  buPROPion (WELLBUTRIN XL) 300 MG 24 hr tablet, Take 1 tablet (300 mg total) by mouth daily., Disp: 30 tablet, Rfl: 11 .  escitalopram (LEXAPRO) 20 MG tablet, Take 20 mg by mouth daily., Disp: , Rfl:  .  gabapentin (NEURONTIN) 100 MG capsule, Take 100 mg by mouth at bedtime., Disp: , Rfl:  .  ibuprofen (ADVIL,MOTRIN) 800 MG tablet, Take 1 tablet (800 mg total) by mouth every 8 (eight) hours as needed., Disp: 30 tablet, Rfl: 3 .  naproxen (EC NAPROSYN) 500 MG EC tablet, Take 1 tablet (500 mg total) by mouth 2 (two) times daily with a meal., Disp: 60 tablet, Rfl: 11 .  predniSONE (DELTASONE) 5 MG tablet, 6 day taper dose. Take 6 tablets, then 5,4,3,2,1 (Patient not taking: Reported on 03/09/2017), Disp: 21 tablet, Rfl: 0 .  sertraline (ZOLOFT) 50 MG tablet, Take 3 tablets (150 mg total) by mouth daily. (Patient not taking: Reported on 03/09/2017), Disp: 90 tablet, Rfl: 12  Review of Systems  Constitutional: Negative for activity  change, appetite change, chills, diaphoresis, fatigue and unexpected weight change.  Eyes: Negative.   Respiratory: Negative.   Cardiovascular: Negative for leg swelling.  Gastrointestinal: Negative.   Musculoskeletal: Positive for arthralgias and myalgias. Negative for neck pain and neck stiffness.  Allergic/Immunologic: Negative.   Neurological: Negative.  Negative for tremors, weakness and numbness.  Psychiatric/Behavioral: Positive for decreased concentration and sleep disturbance. Negative for self-injury and suicidal ideas. The patient is not nervous/anxious.     Social History   Tobacco Use  . Smoking status: Never Smoker  . Smokeless tobacco: Never Used  Substance Use Topics  . Alcohol use: Yes    Alcohol/week: 0.0 oz    Comment: occasionally   Objective:   BP 126/78 (BP Location: Right Arm, Patient Position: Sitting, Cuff Size: Large)   Temp 98 F (36.7 C)   Resp 16   Ht  (1.854 m)   Wt 234 lb (106.1 kg)   BMI 30.87 kg/m  Vitals:   06/22/17 1054  BP: 126/78  Resp: 16  Temp: 98 F (36.7 C)  Weight: 234 lb (106.1 kg)  Height:  (1.854 m)     Physical Exam  Constitutional: He is oriented to person, place, and time. He appears well-developed and well-nourished.  HENT:  Head: Normocephalic and atraumatic.  Eyes: Conjunctivae are normal. No scleral icterus.  Neck: No thyromegaly present.  Cardiovascular: Normal rate, regular rhythm and normal heart sounds.  Pulmonary/Chest: Effort normal and breath sounds normal.  Abdominal: Soft.  Musculoskeletal:  Mild joint line tenderness of knee.  Neurological: He is alert and oriented to person, place, and time.  Skin: Skin is warm and dry.  Psychiatric: He has a normal mood and affect. His behavior is normal. Judgment and thought content normal.        Assessment & Plan:     1. Chronic pain of both knees Pain not controlled with Naproxen. Will start prednisone 6 day taper as below. F/U in 1 month for  cortisone injections w/ Dr. Leonard Schwartz. Try OTC Tumeric daily. - predniSONE (STERAPRED UNI-PAK 21 TAB) 10 MG (21) TBPK tablet; Take dose pack as directed.  Dispense: 21 tablet; Refill: 0      I have done the exam and reviewed the chart and it is accurate to the best of my knowledge. Dentist has been used and  any errors in dictation or transcription are unintentional. Julieanne Manson M.D. [facility]   Megan Mans, MD  [facility]

## 2017-06-30 ENCOUNTER — Telehealth: Payer: Self-pay | Admitting: Family Medicine

## 2017-06-30 NOTE — Telephone Encounter (Signed)
Please advise 

## 2017-06-30 NOTE — Telephone Encounter (Signed)
Pt's wife Fredric Mare (on [place]) stated that pt didn't remember Dr. Sullivan Lone telling him to take predniSONE (STERAPRED UNI-PAK 21 TAB) 10 MG (21) TBPK tablet at last OV 06/22/17. Fredric Mare stated that she went to pharmacy to pick up her medications and was advised that pt hadn't picked up his predniSONE (STERAPRED UNI-PAK 21 TAB) 10 MG (21) TBPK tablet. Pt is scheduled to see Dr. Leonard Schwartz 07/21/17 to discuss possible injections and Fredric Mare wanted to make sure Dr. Sullivan Lone still wants pt to take predniSONE (STERAPRED UNI-PAK 21 TAB) 10 MG (21) TBPK tablet. Please advise. Thanks TNP

## 2017-07-01 NOTE — Telephone Encounter (Signed)
6 day taper 

## 2017-07-01 NOTE — Telephone Encounter (Signed)
Advised 

## 2017-07-08 ENCOUNTER — Ambulatory Visit: Payer: Self-pay | Admitting: Family Medicine

## 2017-07-15 ENCOUNTER — Encounter: Payer: Self-pay | Admitting: Family Medicine

## 2017-07-15 ENCOUNTER — Ambulatory Visit: Payer: 59 | Admitting: Family Medicine

## 2017-07-15 VITALS — BP 110/80 | HR 80 | Temp 98.2°F | Resp 16 | Ht 73.0 in | Wt 236.0 lb

## 2017-07-15 DIAGNOSIS — M25569 Pain in unspecified knee: Secondary | ICD-10-CM

## 2017-07-15 DIAGNOSIS — G8929 Other chronic pain: Secondary | ICD-10-CM

## 2017-07-15 DIAGNOSIS — M25562 Pain in left knee: Secondary | ICD-10-CM

## 2017-07-15 DIAGNOSIS — M25561 Pain in right knee: Secondary | ICD-10-CM | POA: Diagnosis not present

## 2017-07-15 DIAGNOSIS — M170 Bilateral primary osteoarthritis of knee: Secondary | ICD-10-CM

## 2017-07-15 NOTE — Patient Instructions (Signed)
Knee Injection, Care After  Refer to this sheet in the next few weeks. These instructions provide you with information about caring for yourself after your procedure. Your health care provider may also give you more specific instructions. Your treatment has been planned according to current medical practices, but problems sometimes occur. Call your health care provider if you have any problems or questions after your procedure.  What can I expect after the procedure?  After the procedure, it is common to have:   Soreness.   Warmth.   Swelling.    You may have more pain, swelling, and warmth than you did before the injection. This reaction may last for about one day.  Follow these instructions at home:  Bathing   If you were given a bandage (dressing), keep it dry until your health care provider says it can be removed. Ask your health care provider when you can start showering or taking a bath.  Managing pain, stiffness, and swelling   If directed, apply ice to the injection area:  ? Put ice in a plastic bag.  ? Place a towel between your skin and the bag.  ? Leave the ice on for 20 minutes, 2-3 times per day.   Do not apply heat to your knee.   Raise the injection area above the level of your heart while you are sitting or lying down.  Activity   Avoid strenuous activities for as long as directed by your health care provider. Ask your health care provider when you can return to your normal activities.  General instructions   Take medicines only as directed by your health care provider.   Do not take aspirin or other over-the-counter medicines unless your health care provider says you can.   Check your injection site every day for signs of infection. Watch for:  ? Redness, swelling, or pain.  ? Fluid, blood, or pus.   Follow your health care provider's instructions about dressing changes and removal.  Contact a health care provider if:   You have symptoms at your injection site that last longer than  two days after your procedure.   You have redness, swelling, or pain in your injection area.   You have fluid, blood, or pus coming from your injection site.   You have warmth in your injection area.   You have a fever.   Your pain is not controlled with medicine.  Get help right away if:   Your knee turns very red.   Your knee becomes very swollen.   Your knee pain is severe.  This information is not intended to replace advice given to you by your health care provider. Make sure you discuss any questions you have with your health care provider.  Document Released: 02/09/2014 Document Revised: 09/25/2015 Document Reviewed: 11/29/2013  Elsevier Interactive Patient Education  2018 [employer]vier Inc[employer]

## 2017-07-15 NOTE — Progress Notes (Signed)
Patient: Shane Guerrero Male    DOB: [birth year]   37 y.o.   MRN: [id number] Visit Date: 07/15/2017  Today's Provider: Lavon Paganini, MD   Chief Complaint  Patient presents with  . Knee Pain   Subjective:    HPI Patient here today C/O right knee pain on and off since January. Patient reports pain is worse with bending and sitting for a long time. Patient denies any injures or swelling. Patient reports taking Ibuprofen and reports mild symptom control.   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  He has tried prednisone with some relief in the past.  Chart review: ESR 29 and RA factor 15.5 in 02/2017.  Knee XRays in 02/2017 mild degeneration of medial compartment of L knee and normal R knee.     Allergies  Allergen Reactions  . Abilify [Aripiprazole] Other (See Comments)    Excessive hypersomnia and increased appetite     Current Outpatient Medications:  .  amphetamine-dextroamphetamine (ADDERALL) 15 MG tablet, , Disp: , Rfl:  .  aspirin-acetaminophen-caffeine (EXCEDRIN MIGRAINE) 250-250-65 MG tablet, Take 2 tablets by mouth as needed for headache., Disp: , Rfl:  .  buPROPion (WELLBUTRIN XL) 300 MG 24 hr tablet, Take 1 tablet (300 mg total) by mouth daily., Disp: 30 tablet, Rfl: 11 .  escitalopram (LEXAPRO) 20 MG tablet, Take 20 mg by mouth daily., Disp: , Rfl:  .  gabapentin (NEURONTIN) 100 MG capsule, Take 100 mg by mouth at bedtime., Disp: , Rfl:  .  ibuprofen (ADVIL,MOTRIN) 800 MG tablet, Take 1 tablet (800 mg total) by mouth every 8 (eight) hours as needed., Disp: 30 tablet, Rfl: 3 .  naproxen (EC NAPROSYN) 500 MG EC tablet, Take 1 tablet (500 mg total) by mouth 2 (two) times daily with a meal., Disp: 60 tablet, Rfl: 11 .  sertraline (ZOLOFT) 50 MG tablet, Take 3 tablets (150 mg total) by mouth daily. (Patient not taking: Reported on 03/09/2017), Disp: 90 tablet, Rfl: 12  Review of Systems  Constitutional: Negative.   HENT: Negative.     Respiratory: Negative.   Cardiovascular: Negative.   Musculoskeletal: Positive for arthralgias. Negative for gait problem, joint swelling and myalgias.  Skin: Negative.   Neurological: Negative.   Psychiatric/Behavioral: Negative.     Social History   Tobacco Use  . Smoking status: Never Smoker  . Smokeless tobacco: Never Used  Substance Use Topics  . Alcohol use: Yes    Alcohol/week: 0.0 oz    Comment: occasionally   Objective:   BP 110/80 (BP Location: Left Arm, Patient Position: Sitting, Cuff Size: Normal)   Pulse 80   Temp 98.2 F (36.8 C) (Oral)   Resp 16   Ht 6' 1"  (1.854 m)   Wt 236 lb (107 kg)   SpO2 98%   BMI 31.14 kg/m  Vitals:   07/15/17 1611  BP: 110/80  Pulse: 80  Resp: 16  Temp: 98.2 F (36.8 C)  TempSrc: Oral  SpO2: 98%  Weight: 236 lb (107 kg)  Height: 6' 1"  (1.854 m)     Physical Exam  Constitutional: He is oriented to person, place, and time. He appears well-developed and well-nourished. No distress.  HENT:  Head: Normocephalic and atraumatic.  Eyes: Conjunctivae are normal.  Cardiovascular: Normal rate, regular rhythm, normal heart sounds and intact distal pulses.  No murmur heard. Pulmonary/Chest: Effort normal and breath sounds normal. No respiratory distress. He has no wheezes. He  has no rales.  Musculoskeletal:  B/l Knee:  Normal to inspection with no erythema or effusion or obvious bony abnormalities.  Palpation with no warmth or patellar tenderness or condyle tenderness. Mild joint line tenderness. ROM normal in flexion and extension and lower leg rotation. +crepitus of R knee. Ligaments with solid consistent endpoints including ACL, PCL, LCL, MCL. Negative Mcmurray's and provocative meniscal tests. Non painful patellar compression. Patellar and quadriceps tendons unremarkable. Hamstring and quadriceps strength is normal.   Neurological: He is alert and oriented to person, place, and time.  Skin: Skin is warm and dry. Capillary  refill takes less than 2 seconds. No rash noted. No erythema.  Psychiatric: He has a normal mood and affect. His behavior is normal.  Vitals reviewed.      Assessment & Plan:  1. Chronic pain of both knees 2. Osteoarthritis of both knees, unspecified osteoarthritis type -Patient with chronic knee pain with benign exam today - Suspected that he has some degenerative changes, though those are mild on x-rays which were reviewed - He did have elevated ESR and rheumatoid factor in January, so could consider rechecking those and doing additional rheumatoid work-up, especially if he develops pain in other joints - As patient has had some relief with prednisone and NSAIDs, but not complete relief, we will try bilateral corticosteroid injections in his knees today -Discussed return precautions   INJECTION: Patient was given informed consent. Appropriate time out was taken. Area prepped and draped in usual sterile fashion. 1 cc of depo-medrol 80 mg/ml plus  4 cc of 1% lidocaine without epinephrine was injected into each of the bilateral knees using a(n) anteriomedial approach. The patient tolerated the procedure well. There were no complications. Post procedure instructions were given.   Return if symptoms worsen or fail to improve.   The entirety of the information documented in the History of Present Illness, Review of Systems and Physical Exam were personally obtained by me. Portions of this information were initially documented by Raquel Sarna Ratchford, CMA and reviewed by me for thoroughness and accuracy.    Virginia Crews, MD, MPH [facility] 07/16/2017 8:53 AM

## 2017-07-16 MED ORDER — METHYLPREDNISOLONE ACETATE 80 MG/ML IJ SUSP: 80.0000 mg | Freq: Once | INTRAMUSCULAR | Status: DC

## 2017-07-16 NOTE — Addendum Note (Signed)
Addended by: Tobi BastosATCHFORD, EMILY M on: 07/16/2017 02:36 PM   Modules accepted: Orders

## 2017-07-21 ENCOUNTER — Ambulatory Visit: Payer: Self-pay | Admitting: Family Medicine

## 2017-07-29 ENCOUNTER — Ambulatory Visit: Payer: 59 | Admitting: Family Medicine

## 2017-07-29 VITALS — BP 130/80 | HR 92 | Temp 98.1°F | Resp 16 | Wt 237.0 lb

## 2017-07-29 DIAGNOSIS — H6501 Acute serous otitis media, right ear: Secondary | ICD-10-CM

## 2017-07-29 DIAGNOSIS — H9311 Tinnitus, right ear: Secondary | ICD-10-CM

## 2017-07-29 NOTE — Patient Instructions (Signed)
OTC Sudafed 

## 2017-07-29 NOTE — Progress Notes (Signed)
Shane Guerrero  MRN: [id number] DOB: 1980/11/16  Subjective:  HPI   The patient is a 37 year old male who presents with complaint of ringing in his ears.  He states that it started about 1.5 weeks ago after getting injection in his knee.  He said that at times it sounds like crickets. No hearing loss that pt notices. Patient Active Problem List   Diagnosis Date Noted  . Chronic knee pain 07/15/2017  . Bronchitis 08/08/2015  . Bursitis of knee 04/10/2015  . Clinical depression 04/10/2015  . 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 Migraine without aura and responsive to treatment 04/28/2008    No past medical history on file.  Social History   Socioeconomic History  . Marital status: Married    Spouse name: Not on file  . Number of children: Not on file  . Years of education: Not on file  . Highest education level: Not on file  Occupational History  . Not on file  Social Needs  . Financial resource strain: Not on file  . Food insecurity:    Worry: Not on file    Inability: Not on file  . Transportation needs:    Medical: Not on file    Non-medical: Not on file  Tobacco Use  . Smoking status: Never Smoker  . Smokeless tobacco: Never Used  Substance and Sexual Activity  . Alcohol use: Yes    Alcohol/week: 0.0 oz    Comment: occasionally  . Drug use: No  . Sexual activity: Not on file  Lifestyle  . Physical activity:    Days per week: Not on file    Minutes per session: Not on file  . Stress: Not on file  Relationships  . Social connections:    Talks on phone: Not on file    Gets together: Not on file    Attends religious service: Not on file    Active member of club or organization: Not on file    Attends meetings of clubs or organizations: Not on file    Relationship status: Not on file  . Intimate partner violence:    Fear of current or ex partner: Not on file    Emotionally abused: Not on file    Physically abused: Not on file    Forced sexual activity: Not on  file  Other Topics Concern  . Not on file  Social History Narrative  . Not on file    Outpatient Encounter Medications as of 07/29/2017  Medication Sig  . amphetamine-dextroamphetamine (ADDERALL) 15 MG tablet   . aspirin-acetaminophen-caffeine (EXCEDRIN MIGRAINE) 250-250-65 MG tablet Take 2 tablets by mouth as needed for headache.  Marland Kitchen buPROPion (WELLBUTRIN XL) 300 MG 24 hr tablet Take 1 tablet (300 mg total) by mouth daily.  Marland Kitchen escitalopram (LEXAPRO) 20 MG tablet Take 20 mg by mouth daily.  Marland Kitchen gabapentin (NEURONTIN) 100 MG capsule Take 100 mg by mouth at bedtime.  Marland Kitchen ibuprofen (ADVIL,MOTRIN) 800 MG tablet Take 1 tablet (800 mg total) by mouth every 8 (eight) hours as needed.  . naproxen (EC NAPROSYN) 500 MG EC tablet Take 1 tablet (500 mg total) by mouth 2 (two) times daily with a meal.  . sertraline (ZOLOFT) 50 MG tablet Take 3 tablets (150 mg total) by mouth daily. (Patient not taking: Reported on 03/09/2017)   Facility-Administered Encounter Medications as of 07/29/2017  Medication  . methylPREDNISolone acetate (DEPO-MEDROL) injection 80 mg  . methylPREDNISolone acetate (DEPO-MEDROL) injection 80 mg    Allergies  Allergen Reactions  . Abilify [Aripiprazole] Other (See Comments)    Excessive hypersomnia and increased appetite    Review of Systems  Constitutional: Positive for malaise/fatigue. Negative for fever.  HENT: Positive for tinnitus. Negative for congestion, ear discharge, ear pain, sinus pain and sore throat.   Eyes: Negative.   Respiratory: Negative.   Cardiovascular: Negative.   Gastrointestinal: Negative.   Endo/Heme/Allergies: Negative.   Psychiatric/Behavioral: Negative.     Objective:  BP 130/80 (BP Location: Right Arm, Patient Position: Sitting, Cuff Size: Normal)   Pulse 92   Resp 16   Wt 237 lb (107.5 kg)   BMI 31.27 kg/m   Physical Exam  Constitutional: He is oriented to person, place, and time and well-developed, well-nourished, and in no distress.    HENT:  Head: Normocephalic and atraumatic.  Right Ear: External ear normal.  Left Ear: External ear normal.  Nose: Nose normal.  Mouth/Throat: Oropharynx is clear and moist.  TMs full.R>L  Eyes: Conjunctivae are normal. No scleral icterus.  Neck: No thyromegaly present.  Cardiovascular: Normal rate, regular rhythm and normal heart sounds.  Pulmonary/Chest: Effort normal and breath sounds normal.  Abdominal: Soft.  Musculoskeletal: He exhibits no edema.  Neurological: He is alert and oriented to person, place, and time. Gait normal. GCS score is 15.  Skin: Skin is warm and dry.  Psychiatric: Mood, memory, affect and judgment normal.    Assessment and Plan :   Tinnitus Serous Otitis Media Try sudafed a couple of days.  I have done the exam and reviewed the chart and it is accurate to the best of my knowledge. DentistDragon  technology has been used and  any errors in dictation or transcription are unintentional. Julieanne Mansonichard  M.D. [facility]

## 2017-08-11 ENCOUNTER — Encounter: Payer: Self-pay | Admitting: Family Medicine

## 2017-08-12 ENCOUNTER — Ambulatory Visit: Payer: 59 | Admitting: Family Medicine

## 2017-08-12 ENCOUNTER — Encounter: Payer: Self-pay | Admitting: Family Medicine

## 2017-08-12 VITALS — BP 142/84 | HR 80 | Temp 98.4°F | Resp 16 | Wt 240.0 lb

## 2017-08-12 DIAGNOSIS — H9311 Tinnitus, right ear: Secondary | ICD-10-CM

## 2017-08-12 NOTE — Patient Instructions (Signed)
Stop Gabapentin. 

## 2017-08-12 NOTE — Progress Notes (Signed)
Patient: Shane Guerrero Male    DOB: [birth year]   37 y.o.   MRN: [id number] Visit Date: 08/12/2017  Today's Provider: Megan Mans, MD   Chief Complaint  Patient presents with  . Tinnitus   Subjective:    HPI Patient comes in today c/o ringing in his right ear. He was seen in the office on 07/29/2017 with the same symptoms, and was advised to start Sudafed. He reports that this has not helped.     Allergies  Allergen Reactions  . Abilify [Aripiprazole] Other (See Comments)    Excessive hypersomnia and increased appetite     Current Outpatient Medications:  .  amphetamine-dextroamphetamine (ADDERALL) 15 MG tablet, , Disp: , Rfl:  .  aspirin-acetaminophen-caffeine (EXCEDRIN MIGRAINE) 250-250-65 MG tablet, Take 2 tablets by mouth as needed for headache., Disp: , Rfl:  .  buPROPion (WELLBUTRIN XL) 300 MG 24 hr tablet, Take 1 tablet (300 mg total) by mouth daily., Disp: 30 tablet, Rfl: 11 .  escitalopram (LEXAPRO) 20 MG tablet, Take 20 mg by mouth daily., Disp: , Rfl:  .  gabapentin (NEURONTIN) 100 MG capsule, Take 100 mg by mouth at bedtime., Disp: , Rfl:  .  ibuprofen (ADVIL,MOTRIN) 800 MG tablet, Take 1 tablet (800 mg total) by mouth every 8 (eight) hours as needed., Disp: 30 tablet, Rfl: 3 .  naproxen (EC NAPROSYN) 500 MG EC tablet, Take 1 tablet (500 mg total) by mouth 2 (two) times daily with a meal., Disp: 60 tablet, Rfl: 11 .  sertraline (ZOLOFT) 50 MG tablet, Take 3 tablets (150 mg total) by mouth daily. (Patient not taking: Reported on 03/09/2017), Disp: 90 tablet, Rfl: 12  Current Facility-Administered Medications:  .  methylPREDNISolone acetate (DEPO-MEDROL) injection 80 mg, 80 mg, Intramuscular, Once, Bacigalupo, Marzella Schlein, MD .  methylPREDNISolone acetate (DEPO-MEDROL) injection 80 mg, 80 mg, Intramuscular, Once, Bacigalupo, Marzella Schlein, MD  Review of Systems  Constitutional: Negative for activity change, appetite change, chills, diaphoresis, fatigue, fever and  unexpected weight change.  HENT: Positive for tinnitus. Negative for congestion, ear discharge, ear pain, hearing loss, postnasal drip, rhinorrhea, sinus pressure and sinus pain.   Eyes: Negative.   Respiratory: Negative for cough and shortness of breath.   Cardiovascular: Negative for chest pain, palpitations and leg swelling.  Gastrointestinal: Negative.   Allergic/Immunologic: Negative for environmental allergies.  Psychiatric/Behavioral: Negative.     Social History   Tobacco Use  . Smoking status: Never Smoker  . Smokeless tobacco: Never Used  Substance Use Topics  . Alcohol use: Yes    Alcohol/week: 0.0 oz    Comment: occasionally   Objective:   BP (!) 142/84 (BP Location: Left Arm, Patient Position: Sitting, Cuff Size: Normal)   Pulse 80   Temp 98.4 F (36.9 C)   Resp 16   Wt 240 lb (108.9 kg)   SpO2 99%   BMI 31.66 kg/m  Vitals:   08/12/17 1628  BP: (!) 142/84  Pulse: 80  Resp: 16  Temp: 98.4 F (36.9 C)  SpO2: 99%  Weight: 240 lb (108.9 kg)     Physical Exam  Constitutional: He is oriented to person, place, and time. He appears well-developed and well-nourished.  HENT:  Head: Normocephalic and atraumatic.  Right Ear: External ear normal.  Left Ear: External ear normal.  Nose: Nose normal.  Mouth/Throat: Oropharynx is clear and moist.  Eyes: Pupils are equal, round, and reactive to light. Conjunctivae are normal.  Neck: No thyromegaly present.  Cardiovascular: Normal rate, regular rhythm and normal heart sounds.  Pulmonary/Chest: Effort normal and breath sounds normal.  Abdominal: Soft.  Musculoskeletal: He exhibits no edema.  Lymphadenopathy:    He has no cervical adenopathy.  Neurological: He is alert and oriented to person, place, and time.  Skin: Skin is warm and dry.  Psychiatric: He has a normal mood and affect. His behavior is normal. Judgment and thought content normal.        Assessment & Plan:     1. Tinnitus of right ear No  dizziness. - Ambulatory referral to ENT      I have done the exam and reviewed the above chart and it is accurate to the best of my knowledge. DentistDragon  technology has been used in this note in any air is in the dictation or transcription are unintentional.  Megan Mansichard  Jr, MD  [facility]

## 2017-09-02 DIAGNOSIS — H903 Sensorineural hearing loss, bilateral: Secondary | ICD-10-CM | POA: Diagnosis not present

## 2017-09-02 DIAGNOSIS — H9113 Presbycusis, bilateral: Secondary | ICD-10-CM | POA: Diagnosis not present

## 2017-09-02 DIAGNOSIS — H9190 Unspecified hearing loss, unspecified ear: Secondary | ICD-10-CM | POA: Diagnosis not present

## 2017-09-02 DIAGNOSIS — H9122 Sudden idiopathic hearing loss, left ear: Secondary | ICD-10-CM | POA: Diagnosis not present

## 2017-09-17 DIAGNOSIS — H903 Sensorineural hearing loss, bilateral: Secondary | ICD-10-CM | POA: Diagnosis not present

## 2017-09-17 DIAGNOSIS — H9122 Sudden idiopathic hearing loss, left ear: Secondary | ICD-10-CM | POA: Diagnosis not present

## 2017-09-17 DIAGNOSIS — H9113 Presbycusis, bilateral: Secondary | ICD-10-CM | POA: Diagnosis not present

## 2017-09-23 ENCOUNTER — Other Ambulatory Visit: Payer: Self-pay | Admitting: Otolaryngology

## 2017-09-23 DIAGNOSIS — Z79899 Other long term (current) drug therapy: Secondary | ICD-10-CM | POA: Diagnosis not present

## 2017-09-23 DIAGNOSIS — H9122 Sudden idiopathic hearing loss, left ear: Secondary | ICD-10-CM

## 2017-09-29 DIAGNOSIS — Z79899 Other long term (current) drug therapy: Secondary | ICD-10-CM | POA: Diagnosis not present

## 2017-10-20 DIAGNOSIS — H903 Sensorineural hearing loss, bilateral: Secondary | ICD-10-CM | POA: Diagnosis not present

## 2017-10-20 DIAGNOSIS — H9122 Sudden idiopathic hearing loss, left ear: Secondary | ICD-10-CM | POA: Diagnosis not present

## 2017-10-28 DIAGNOSIS — Z79899 Other long term (current) drug therapy: Secondary | ICD-10-CM | POA: Diagnosis not present

## 2017-11-02 DIAGNOSIS — Z79899 Other long term (current) drug therapy: Secondary | ICD-10-CM | POA: Diagnosis not present

## 2017-12-23 DIAGNOSIS — Z79899 Other long term (current) drug therapy: Secondary | ICD-10-CM | POA: Diagnosis not present

## 2017-12-29 DIAGNOSIS — Z79899 Other long term (current) drug therapy: Secondary | ICD-10-CM | POA: Diagnosis not present

## 2018-04-04 ENCOUNTER — Other Ambulatory Visit: Payer: Self-pay | Admitting: Family Medicine

## 2018-04-04 DIAGNOSIS — G8929 Other chronic pain: Secondary | ICD-10-CM

## 2018-04-04 DIAGNOSIS — M25561 Pain in right knee: Principal | ICD-10-CM

## 2018-04-04 DIAGNOSIS — M25562 Pain in left knee: Principal | ICD-10-CM

## 2018-07-20 ENCOUNTER — Other Ambulatory Visit: Payer: Self-pay

## 2018-07-20 ENCOUNTER — Telehealth (INDEPENDENT_AMBULATORY_CARE_PROVIDER_SITE_OTHER): Payer: 59 | Admitting: Family Medicine

## 2018-07-20 DIAGNOSIS — L237 Allergic contact dermatitis due to plants, except food: Secondary | ICD-10-CM | POA: Diagnosis not present

## 2018-07-20 MED ORDER — PREDNISONE 10 MG PO TABS: ORAL_TABLET | ORAL | 0 refills | Status: AC

## 2018-07-20 NOTE — Progress Notes (Signed)
Patient: Ledger Heindl Male    DOB: July 05, 1980   38 y.o.   MRN: [id number] Visit Date: 07/20/2018  Today's Provider: Lelon Huh, MD   Chief Complaint  Patient presents with  . Rash   Subjective:    Virtual Visit via Video Note  I connected with Avel Sensor on 07/20/18 at  8:40 AM EDT by a video enabled telemedicine application and verified that I am speaking with the correct person using two identifiers.   I discussed the limitations of evaluation and management by telemedicine and the availability of in person appointments. The patient expressed understanding and agreed to proceed.   This is a patient of Dr. Alben Spittle who is new to me.  Rash This is a new problem. The affected locations include the face, left arm and right arm. Pertinent negatives include no fever, shortness of breath or vomiting.  states started breaking out around eyes yesterday. Is very itchy Was exposed to wild plants 3 or 4 days ago. No fever. No blisters. Has had similar reactions several times in the past and only that cleared it was prednisone.   Allergies  Allergen Reactions  . Abilify [Aripiprazole] Other (See Comments)    Excessive hypersomnia and increased appetite     Current Outpatient Medications:  .  amphetamine-dextroamphetamine (ADDERALL) 15 MG tablet, , Disp: , Rfl:  .  buPROPion (WELLBUTRIN XL) 300 MG 24 hr tablet, Take 1 tablet (300 mg total) by mouth daily., Disp: 30 tablet, Rfl: 11 .  escitalopram (LEXAPRO) 20 MG tablet, Take 20 mg by mouth daily., Disp: , Rfl:  .  naproxen (NAPROSYN) 500 MG tablet, TAKE 1 TABLET BY MOUTH 2 TIMES DAILY WITH A MEAL, Disp: 60 tablet, Rfl: 4 .  FLUoxetine (PROZAC) 20 MG capsule, Take 1 capsule by mouth daily., Disp: , Rfl:  .  gabapentin (NEURONTIN) 100 MG capsule, Take 100 mg by mouth at bedtime., Disp: , Rfl:  .  ibuprofen (ADVIL,MOTRIN) 800 MG tablet, Take 1 tablet (800 mg total) by mouth every 8 (eight) hours as needed. (Patient not taking:  Reported on 07/20/2018), Disp: 30 tablet, Rfl: 3  Review of Systems  Constitutional: Negative for appetite change, chills and fever.  Respiratory: Negative for chest tightness, shortness of breath and wheezing.   Cardiovascular: Negative for chest pain and palpitations.  Gastrointestinal: Negative for abdominal pain, nausea and vomiting.  Skin: Positive for rash.    Social History   Tobacco Use  . Smoking status: Never Smoker  . Smokeless tobacco: Never Used  Substance Use Topics  . Alcohol use: Yes    Alcohol/week: 0.0 standard drinks    Comment: occasionally      Objective:   There were no vitals taken for this visit. There were no vitals filed for this visit.   Physical Exam  alert, well developed, well nourished, cooperative and in no distress Patient unable to transmit video stream.       Assessment & Plan    1. Allergic contact dermatitis due to plants, except food Unable to visualize due to technical problems with virtual visit, but patient's history and description are consistent with contact dermatitis.  - predniSONE (DELTASONE) 10 MG tablet; 6 tablets for 2 days, then 5 for 2 days, then 4 for 2 days, then 3 for 2 days, then 2 for 2 days, then 1 for 2 days.  Dispense: 42 tablet; Refill: 0 Call if symptoms change or if not rapidly improving.  I discussed the assessment and treatment plan with the patient. The patient was provided an opportunity to ask questions and all were answered. The patient agreed with the plan and demonstrated an understanding of the instructions.   The patient was advised to call back or seek an in-person evaluation if the symptoms worsen or if the condition fails to improve as anticipated.  I provided 10 minutes of non-face-to-face time during this encounter.      Mila Merryonald Fisher, MD  [facility]

## 2019-01-03 ENCOUNTER — Other Ambulatory Visit: Payer: Self-pay | Admitting: Family Medicine

## 2019-01-03 DIAGNOSIS — G8929 Other chronic pain: Secondary | ICD-10-CM

## 2019-01-03 DIAGNOSIS — M25562 Pain in left knee: Secondary | ICD-10-CM

## 2019-01-17 ENCOUNTER — Other Ambulatory Visit: Payer: Self-pay

## 2019-01-17 ENCOUNTER — Ambulatory Visit (INDEPENDENT_AMBULATORY_CARE_PROVIDER_SITE_OTHER): Payer: 59 | Admitting: Family Medicine

## 2019-01-17 ENCOUNTER — Encounter: Payer: Self-pay | Admitting: *Deleted

## 2019-01-17 ENCOUNTER — Encounter: Payer: Self-pay | Admitting: Family Medicine

## 2019-01-17 VITALS — BP 126/88 | HR 87 | Temp 96.8°F | Resp 16 | Ht 73.0 in | Wt 273.0 lb

## 2019-01-17 DIAGNOSIS — G43009 Migraine without aura, not intractable, without status migrainosus: Secondary | ICD-10-CM | POA: Diagnosis not present

## 2019-01-17 DIAGNOSIS — G459 Transient cerebral ischemic attack, unspecified: Secondary | ICD-10-CM

## 2019-01-17 DIAGNOSIS — M25561 Pain in right knee: Secondary | ICD-10-CM | POA: Diagnosis not present

## 2019-01-17 DIAGNOSIS — G8929 Other chronic pain: Secondary | ICD-10-CM

## 2019-01-17 DIAGNOSIS — F331 Major depressive disorder, recurrent, moderate: Secondary | ICD-10-CM

## 2019-01-17 DIAGNOSIS — M25562 Pain in left knee: Secondary | ICD-10-CM

## 2019-01-17 NOTE — Progress Notes (Signed)
Patient: Shane Guerrero Male    DOB: [birth year]   38 y.o.   MRN: [id number] Visit Date: 01/17/2019  Today's Provider: Wilhemena Durie, MD   Chief Complaint  Patient presents with  . Hospitalization Follow-up   Subjective:     HPI    Follow up ER visit  Patient was seen in ER for Altered Mental State on 01/15/2019. He was treated for possible TIA./RIND.Afterward pt was drowsy and had a headache. Negative head CT and uring drug screen. Have reviewed all info from ED. Pt sees Dr Vena Rua from psychiatry and he is given Adline Peals which is a weekly nasal ketamine dose. This is a supervised dosing. Treatment for this included; CT brain scan, labs, MRI brain. He reports good compliance with treatment. He reports this condition is Improved.  ----------------------------------------------------------- Patient states he feels fine today. Patient does not remember much that happened Saturday before and during ER visit.   Allergies  Allergen Reactions  . Abilify [Aripiprazole] Other (See Comments)    Excessive hypersomnia and increased appetite     Current Outpatient Medications:  .  amphetamine-dextroamphetamine (ADDERALL) 15 MG tablet, , Disp: , Rfl:  .  B Complex Vitamins (B COMPLEX 1 PO), Take by mouth., Disp: , Rfl:  .  buPROPion (WELLBUTRIN XL) 300 MG 24 hr tablet, Take 1 tablet (300 mg total) by mouth daily., Disp: 30 tablet, Rfl: 11 .  FLUoxetine (PROZAC) 10 MG capsule, Take 10 mg by mouth 3 (three) times daily. , Disp: , Rfl:  .  FLUoxetine (PROZAC) 20 MG capsule, Take 1 capsule by mouth daily., Disp: , Rfl:  .  naproxen (NAPROSYN) 500 MG tablet, TAKE 1 TABLET BY MOUTH TWICE (2) DAILY WITH A MEAL, Disp: 60 tablet, Rfl: 4 .  escitalopram (LEXAPRO) 20 MG tablet, Take 20 mg by mouth daily., Disp: , Rfl:  .  gabapentin (NEURONTIN) 100 MG capsule, Take 100 mg by mouth at bedtime., Disp: , Rfl:  .  ibuprofen (ADVIL,MOTRIN) 800 MG tablet, Take 1 tablet (800 mg total) by  mouth every 8 (eight) hours as needed. (Patient not taking: Reported on 07/20/2018), Disp: 30 tablet, Rfl: 3  Review of Systems  Constitutional: Negative.  Negative for appetite change, chills and fever.  HENT: Negative.   Eyes: Negative.   Respiratory: Negative.  Negative for chest tightness, shortness of breath and wheezing.   Cardiovascular: Negative.  Negative for chest pain and palpitations.  Gastrointestinal: Negative.  Negative for abdominal pain, nausea and vomiting.  Endocrine: Negative.   Genitourinary: Negative.   Musculoskeletal: Positive for arthralgias.  Skin: Negative.   Allergic/Immunologic: Negative.   Neurological: Negative.   Hematological: Negative.   Psychiatric/Behavioral: Negative.   Neuro back to normal.  Social History   Tobacco Use  . Smoking status: Never Smoker  . Smokeless tobacco: Never Used  Substance Use Topics  . Alcohol use: Yes    Alcohol/week: 0.0 standard drinks    Comment: occasionally      Objective:   BP 126/88 (BP Location: Left Arm, Patient Position: Sitting, Cuff Size: Large)   Pulse 87   Temp (!) 96.8 F (36 C) (Other (Comment))   Resp 16   Ht 6\' 1"  (1.854 m)   Wt 273 lb (123.8 kg)   SpO2 97%   BMI 36.02 kg/m  Vitals:   01/17/19 1529  BP: 126/88  Pulse: 87  Resp: 16  Temp: (!) 96.8 F (36 C)  TempSrc: Other (Comment)  SpO2: 97%  Weight: 273 lb (123.8 kg)  Height: 6\' 1"  (1.854 m)  Body mass index is 36.02 kg/m.   Physical Exam Vitals reviewed.  Constitutional:      Appearance: He is well-developed.  HENT:     Head: Normocephalic and atraumatic.     Right Ear: External ear normal.     Left Ear: External ear normal.     Nose: Nose normal.  Eyes:     Conjunctiva/sclera: Conjunctivae normal.     Pupils: Pupils are equal, round, and reactive to light.  Neck:     Thyroid: No thyromegaly.  Cardiovascular:     Rate and Rhythm: Normal rate and regular rhythm.     Heart sounds: Normal heart sounds.  Pulmonary:      Effort: Pulmonary effort is normal.     Breath sounds: Normal breath sounds.  Abdominal:     Palpations: Abdomen is soft.  Lymphadenopathy:     Cervical: No cervical adenopathy.  Skin:    General: Skin is warm and dry.  Neurological:     General: No focal deficit present.     Mental Status: He is alert and oriented to person, place, and time.     Cranial Nerves: No cranial nerve deficit.     Motor: No weakness.     Coordination: Coordination normal.     Gait: Gait normal.  Psychiatric:        Mood and Affect: Mood normal.        Behavior: Behavior normal.        Thought Content: Thought content normal.        Judgment: Judgment normal.      No results found for any visits on 01/17/19.     Assessment & Plan     1. TIA (transient ischemic attack) TIA/RIND vs seizure. More than 50% 25 minute visit spent in counseling or coordination of care  - Ambulatory referral to Neurology-urgent but not emergent.  2. Migraine without aura and responsive to treatment Could have been contributory.  3. Moderate episode of recurrent major depressive disorder (HCC) Per psychiaty.  4. Chronic pain of both knees    Follow up in the summer for CPE.     I,Trejon Duford,acting as a scribe for 01/19/19, MD.,have documented all relevant documentation on the behalf of Megan Mans, MD,as directed by  Megan Mans, MD while in the presence of Megan Mans, MD.      Megan Mans, MD  [facility]

## 2019-01-19 ENCOUNTER — Other Ambulatory Visit: Payer: Self-pay | Admitting: Neurology

## 2019-01-19 DIAGNOSIS — G459 Transient cerebral ischemic attack, unspecified: Secondary | ICD-10-CM

## 2019-01-31 ENCOUNTER — Ambulatory Visit
Admission: RE | Admit: 2019-01-31 | Discharge: 2019-01-31 | Disposition: A | Discharge status: Home / Self Care | Payer: 59 | Source: Ambulatory Visit | Attending: Neurology | Admitting: Neurology

## 2019-01-31 ENCOUNTER — Other Ambulatory Visit: Payer: Self-pay

## 2019-01-31 DIAGNOSIS — G459 Transient cerebral ischemic attack, unspecified: Secondary | ICD-10-CM | POA: Diagnosis not present

## 2019-01-31 MED ORDER — GADOBUTROL 1 MMOL/ML IV SOLN
10.0000 mL | Freq: Once | INTRAVENOUS | Status: AC | PRN
  Administered 2019-01-31: 10 mL via INTRAVENOUS

## 2019-03-21 ENCOUNTER — Encounter: Payer: Self-pay | Admitting: Family Medicine

## 2019-03-21 ENCOUNTER — Telehealth (INDEPENDENT_AMBULATORY_CARE_PROVIDER_SITE_OTHER): Payer: 59 | Admitting: Family Medicine

## 2019-03-21 VITALS — BP 109/79

## 2019-03-21 DIAGNOSIS — G459 Transient cerebral ischemic attack, unspecified: Secondary | ICD-10-CM | POA: Diagnosis not present

## 2019-03-21 DIAGNOSIS — F331 Major depressive disorder, recurrent, moderate: Secondary | ICD-10-CM | POA: Diagnosis not present

## 2019-03-21 DIAGNOSIS — G4733 Obstructive sleep apnea (adult) (pediatric): Secondary | ICD-10-CM

## 2019-03-21 DIAGNOSIS — Z9989 Dependence on other enabling machines and devices: Secondary | ICD-10-CM

## 2019-03-21 NOTE — Progress Notes (Signed)
Patient: Shane Guerrero Male    DOB: September 13, 1980   39 y.o.   MRN: [id number] Visit Date: 03/21/2019  Today's Provider: Wilhemena Durie, MD   Chief Complaint  Patient presents with  . Sleep Apnea   Subjective:   Virtual Visit via Video Note  I connected with Avel Sensor on 03/21/19 at  4:00 PM EST by a video enabled telemedicine application and verified that I am speaking with the correct person using two identifiers.  Location: Patient: Home Provider: Office   I discussed the limitations of evaluation and management by telemedicine and the availability of in person appointments. The patient expressed understanding and agreed to proceed.    Follow up for Obstructive sleep apnea The patient states that his wife says that he is snoring through the CPAP which is an auto titrating CPAP. The patient was last seen for this 2 years ago, on 09/15/2016. Changes made at last visit include referring patient to Dr. Manuella Ghazi at [facility] for possible narcolepsy and sleep study. Results mild to moderate obstructive sleep apnea. Patient was set on AutoPAP 5 to 20 cm H2o.  He reports excellent compliance with treatment. He feels that condition is Worse. Patient reports that in the last few weeks he has been snoring more. Patient reports that wife is concerned and thinks he may need to have a repeat sleep study. He is not having side effects.   Results of the Epworth flowsheet 03/21/2019 05/26/2016  Sitting and reading 1 2  Watching TV 2 3  Sitting, inactive in a public place (e.g. a theatre or a meeting) 0 1  As a passenger in a car for an hour without a break 1 0  Lying down to rest in the afternoon when circumstances permit 2 2  Sitting and talking to someone 0 0  Sitting quietly after a lunch without alcohol 1 1  In a car, while stopped for a few minutes in traffic 0 0  Total score 7 9     ------------------------------------------------------------------------------------        Allergies  Allergen Reactions  . Abilify [Aripiprazole] Other (See Comments)    Excessive hypersomnia and increased appetite     Current Outpatient Medications:  .  amphetamine-dextroamphetamine (ADDERALL) 15 MG tablet, , Disp: , Rfl:  .  B Complex Vitamins (B COMPLEX 1 PO), Take by mouth., Disp: , Rfl:  .  buPROPion (WELLBUTRIN XL) 300 MG 24 hr tablet, Take 1 tablet (300 mg total) by mouth daily., Disp: 30 tablet, Rfl: 11 .  FLUoxetine (PROZAC) 10 MG capsule, Take 10 mg by mouth 3 (three) times daily. , Disp: , Rfl:  .  FLUoxetine (PROZAC) 20 MG capsule, Take 1 capsule by mouth daily., Disp: , Rfl:  .  ibuprofen (ADVIL,MOTRIN) 800 MG tablet, Take 1 tablet (800 mg total) by mouth every 8 (eight) hours as needed., Disp: 30 tablet, Rfl: 3 .  naproxen (NAPROSYN) 500 MG tablet, TAKE 1 TABLET BY MOUTH TWICE (2) DAILY WITH A MEAL, Disp: 60 tablet, Rfl: 4  Review of Systems  Constitutional: Positive for fatigue. Negative for appetite change, chills and fever.  Respiratory: Negative for chest tightness, shortness of breath and wheezing.   Cardiovascular: Negative for chest pain and palpitations.  Gastrointestinal: Negative for abdominal pain, nausea and vomiting.    Social History   Tobacco Use  . Smoking status: Never Smoker  . Smokeless tobacco: Never Used  Substance Use Topics  . Alcohol use: Yes  Alcohol/week: 0.0 standard drinks    Comment: occasionally      Objective:   BP 109/79 (BP Location: Left Arm, Patient Position: Sitting, Cuff Size: Large)  Vitals:   03/21/19 1533  BP: 109/79  There is no height or weight on file to calculate BMI.   Physical Exam   No results found for any visits on 03/21/19.     Assessment & Plan     1. OSA on CPAP Will discuss with Dr. Sherryll Burger but I am considering referring him to Dr. Nicholos Johns who is a sleep specialist.  2. TIA (transient ischemic attack) I do not think he has seen Dr. Clelia Croft after the full evaluation for  work-up.  No recurrent symptoms.  3. Moderate episode of recurrent major depressive disorder [facility]) Per psychiatry.  I discussed the assessment and treatment plan with the patient. The patient was provided an opportunity to ask questions and all were answered. The patient agreed with the plan and demonstrated an understanding of the instructions.   The patient was advised to call back or seek an in-person evaluation if the symptoms worsen or if the condition fails to improve as anticipated.  I provided 10 minutes of non-face-to-face time during this encounter.    Richard Wendelyn Breslow, MD  [facility]

## 2019-04-10 ENCOUNTER — Telehealth: Payer: Self-pay

## 2019-04-10 NOTE — Telephone Encounter (Signed)
Copied from CRM [id number]. Topic: General - Other >> Apr 10, 2019  2:01 PM Gwenlyn Fudge wrote: Reason for CRM: Pts wife called stating that the pt is needing to have the referral for his sleep study done at another office. She states they cannot afford to pay up front for the sleep study at a private practice. Please advise.

## 2019-04-10 NOTE — Telephone Encounter (Signed)
Please advise? Okay to order referral?

## 2019-04-12 ENCOUNTER — Encounter: Payer: Self-pay | Admitting: Family Medicine

## 2019-04-12 ENCOUNTER — Telehealth (INDEPENDENT_AMBULATORY_CARE_PROVIDER_SITE_OTHER): Payer: 59 | Admitting: Family Medicine

## 2019-04-12 DIAGNOSIS — Z9989 Dependence on other enabling machines and devices: Secondary | ICD-10-CM | POA: Diagnosis not present

## 2019-04-12 DIAGNOSIS — E6609 Other obesity due to excess calories: Secondary | ICD-10-CM

## 2019-04-12 DIAGNOSIS — G4733 Obstructive sleep apnea (adult) (pediatric): Secondary | ICD-10-CM

## 2019-04-12 DIAGNOSIS — Z6831 Body mass index (BMI) 31.0-31.9, adult: Secondary | ICD-10-CM

## 2019-04-12 DIAGNOSIS — F331 Major depressive disorder, recurrent, moderate: Secondary | ICD-10-CM

## 2019-04-12 NOTE — Progress Notes (Addendum)
Patient: Shane Guerrero Male    DOB: 10-12-1980   39 y.o.   MRN: [id number] Visit Date: 04/12/2019  Today's Provider: Megan Mans, MD   No chief complaint on file.  Subjective:    Virtual Visit via Video Note  I connected with Cori Razor on 04/12/19 at  3:20 PM EST by a video enabled telemedicine application and verified that I am speaking with the correct person using two identifiers.  Location: Patient: Home Provider: Office    I discussed the limitations of evaluation and management by telemedicine and the availability of in person appointments. The patient expressed understanding and agreed to proceed.    HPI Patient has gained weight during the Covid pandemic and is having trouble with his sleep apnea.  Unfortunately the wife does most of the talking during the interview but the patient does agree with her assessment.  Evidently the patient has been on his CPAP and is helped on a maximum CPAP at 20 cm water but is still snoring with the CPAP.  He is tired and his psychiatrist also wants him to have a CPAP repeated.  I think it is most appropriate at this time to refer to a sleep specialist.  Due to insurance reasons they need to have it done at a Medical Center such as [facility] or [place] or [facility] for [facility] Allergies  Allergen Reactions  . Abilify [Aripiprazole] Other (See Comments)    Excessive hypersomnia and increased appetite     Current Outpatient Medications:  .  amphetamine-dextroamphetamine (ADDERALL) 15 MG tablet, , Disp: , Rfl:  .  B Complex Vitamins (B COMPLEX 1 PO), Take by mouth., Disp: , Rfl:  .  buPROPion (WELLBUTRIN XL) 300 MG 24 hr tablet, Take 1 tablet (300 mg total) by mouth daily., Disp: 30 tablet, Rfl: 11 .  FLUoxetine (PROZAC) 10 MG capsule, Take 10 mg by mouth 3 (three) times daily. , Disp: , Rfl:  .  FLUoxetine (PROZAC) 20 MG capsule, Take 1 capsule by mouth daily., Disp: , Rfl:  .  ibuprofen (ADVIL,MOTRIN) 800 MG tablet, Take 1 tablet  (800 mg total) by mouth every 8 (eight) hours as needed., Disp: 30 tablet, Rfl: 3 .  naproxen (NAPROSYN) 500 MG tablet, TAKE 1 TABLET BY MOUTH TWICE (2) DAILY WITH A MEAL, Disp: 60 tablet, Rfl: 4  Review of Systems  Constitutional: Negative for appetite change, chills and fever.  Respiratory: Negative for chest tightness, shortness of breath and wheezing.   Cardiovascular: Negative for chest pain and palpitations.  Gastrointestinal: Negative for abdominal pain, nausea and vomiting.    Social History   Tobacco Use  . Smoking status: Never Smoker  . Smokeless tobacco: Never Used  Substance Use Topics  . Alcohol use: Yes    Alcohol/week: 0.0 standard drinks    Comment: occasionally      Objective:   There were no vitals taken for this visit. There were no vitals filed for this visit.There is no height or weight on file to calculate BMI.   Physical Exam   No results found for any visits on 04/12/19.     Assessment & Plan     1. OSA on CPAP After discussion with the patient will attempt to refer to a sleep specialist as he seems to be on a maximum CPAP.  May be a different headgear will be appropriate but I will have to defer to a sleep medicine specialist.  He wishes to find 1  at one of the local medical centers  - Ambulatory referral to Pulmonology  2. Moderate episode of recurrent major depressive disorder [facility]) Per psychiatry  3. Class 1 obesity due to excess calories without serious comorbidity with body mass index (BMI) of 31.0 to 31.9 in adult   I discussed the assessment and treatment plan with the patient. The patient was provided an opportunity to ask questions and all were answered. The patient agreed with the plan and demonstrated an understanding of the instructions.   The patient was advised to call back or seek an in-person evaluation if the symptoms worsen or if the condition fails to improve as anticipated.  I provided 11 minutes of non-face-to-face time  during this encounter.    Richard Cranford Mon, MD  [facility]

## 2019-04-12 NOTE — Telephone Encounter (Signed)
Patient had mychart visit with provider to discuss referral.

## 2019-04-12 NOTE — Telephone Encounter (Signed)
Copied from CRM [id number]. Topic: General - Other >> Apr 12, 2019  9:25 AM Gwenlyn Fudge wrote: Reason for CRM: Pts wife called stating that she has some questions regarding pts follow up treatment and is requesting to have a call back to go over this with PCP or nurse. Please advise.

## 2019-04-12 NOTE — Addendum Note (Signed)
Addended by: Marlene Lard on: 04/12/2019 03:47 PM   Modules accepted: Orders

## 2019-04-20 ENCOUNTER — Encounter: Payer: Self-pay | Admitting: Family Medicine

## 2019-04-27 ENCOUNTER — Ambulatory Visit: Payer: Self-pay | Admitting: *Deleted

## 2019-04-27 NOTE — Telephone Encounter (Signed)
Pt and his wife called because the pt has complaints of being light headed 04/26/19; the pt says that he has a history of "migraines without headache"; his BP was 140/80 on 04/26/19 at 2330; he says this is elevated for him; the pt says he drinks 80 oz of water, tea,coffee, and other liquids daily; recommendations made per nurse triage protocol; they verbalize understanding; the pt sees Dr Julieanne Manson, [facility]; will route to office for notification.   Reason for Disposition . Patient sounds very sick or weak to the triager  Answer Assessment - Initial Assessment Questions 1. DESCRIPTION: "Describe your dizziness."     Lightheaded after moving; "feels like dissociation"  2. LIGHTHEADED: "Do you feel lightheaded?" (e.g., somewhat faint, woozy, weak upon standing)     "not quite" 3. VERTIGO: "Do you feel like either you or the room is spinning or tilting?" (i.e. vertigo)    no 4. SEVERITY: "How bad is it?"  "Do you feel like you are going to faint?" "Can you stand and walk?"   - MILD - walking normally   - MODERATE - interferes with normal activities (e.g., work, school)    - SEVERE - unable to stand, requires support to walk, feels like passing out now.      moderate 5. ONSET:  "When did the dizziness begin?"     04/26/19 6. AGGRAVATING FACTORS: "Does anything make it worse?" (e.g., standing, change in head position)     General movement makes worse 7. HEART RATE: "Can you tell me your heart rate?" "How many beats in 15 seconds?"  (Note: not all patients can do this)     20 beats in 15 seconds 8. CAUSE: "What do you think is causing the dizziness?"     ?ograine 9. RECURRENT SYMPTOM: "Have you had dizziness before?" If so, ask: "When was the last time?" "What happened that time?"     no 10. OTHER SYMPTOMS: "Do you have any other symptoms?" (e.g., fever, chest pain, vomiting, diarrhea, bleeding)       Sweating with episodes after movment 11. PREGNANCY: "Is there any  chance you are pregnant?" "When was your last menstrual period?"       n/a  Protocols used: DIZZINESS [facility]

## 2019-04-28 MED ORDER — ENOXAPARIN SODIUM 40 MG/0.4ML ~~LOC~~ SOLN
40.00 | SUBCUTANEOUS | Status: DC
Start: 2019-04-29 — End: 2019-04-28

## 2019-04-28 MED ORDER — GENERIC EXTERNAL MEDICATION
30.00 | Status: DC
Start: 2019-04-29 — End: 2019-04-28

## 2019-04-28 MED ORDER — TAMSULOSIN HCL 0.4 MG PO CAPS
0.40 | ORAL_CAPSULE | ORAL | Status: DC
Start: ? — End: 2019-04-28

## 2019-04-28 MED ORDER — BUPROPION HCL ER (XL) 300 MG PO TB24
300.00 | ORAL_TABLET | ORAL | Status: DC
Start: 2019-04-29 — End: 2019-04-28

## 2019-04-28 MED ORDER — NAPROXEN 500 MG PO TABS
500.00 | ORAL_TABLET | ORAL | Status: DC
Start: ? — End: 2019-04-28

## 2019-05-03 IMAGING — CR DG KNEE COMPLETE 4+V*R*
1 series · 4 of 4 positions shown · non-contrast
Comparison: None

CLINICAL DATA: Anterior and medial pain at both knees greater on
RIGHT, no known injury

EXAM:
RIGHT KNEE - COMPLETE 4+ VIEW

[Series 1: dg knee complete 4 views right · 0.14mm/px · 4 of 4 slices shown]
[im 1/4]
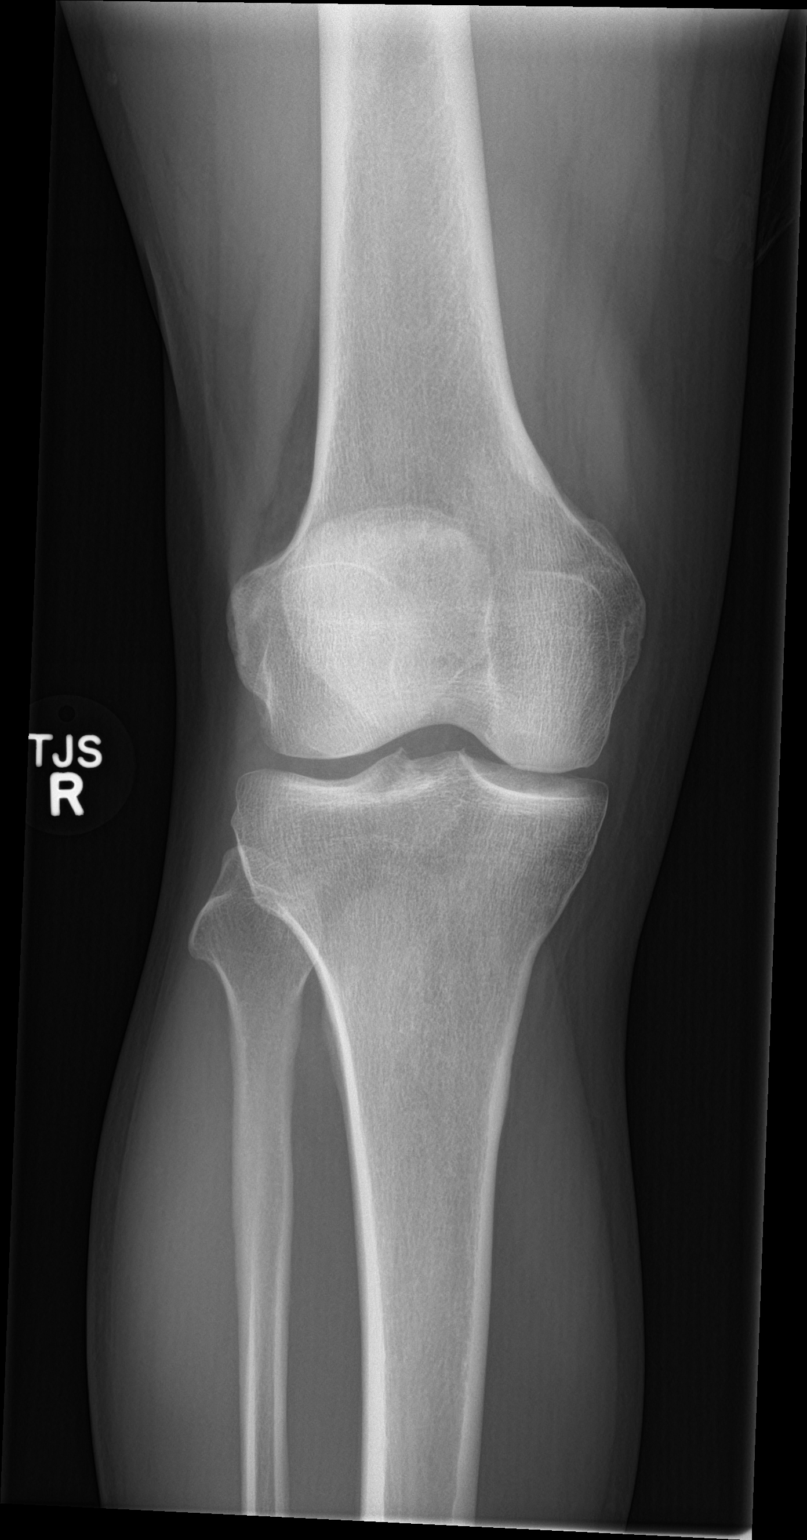
[im 2/4]
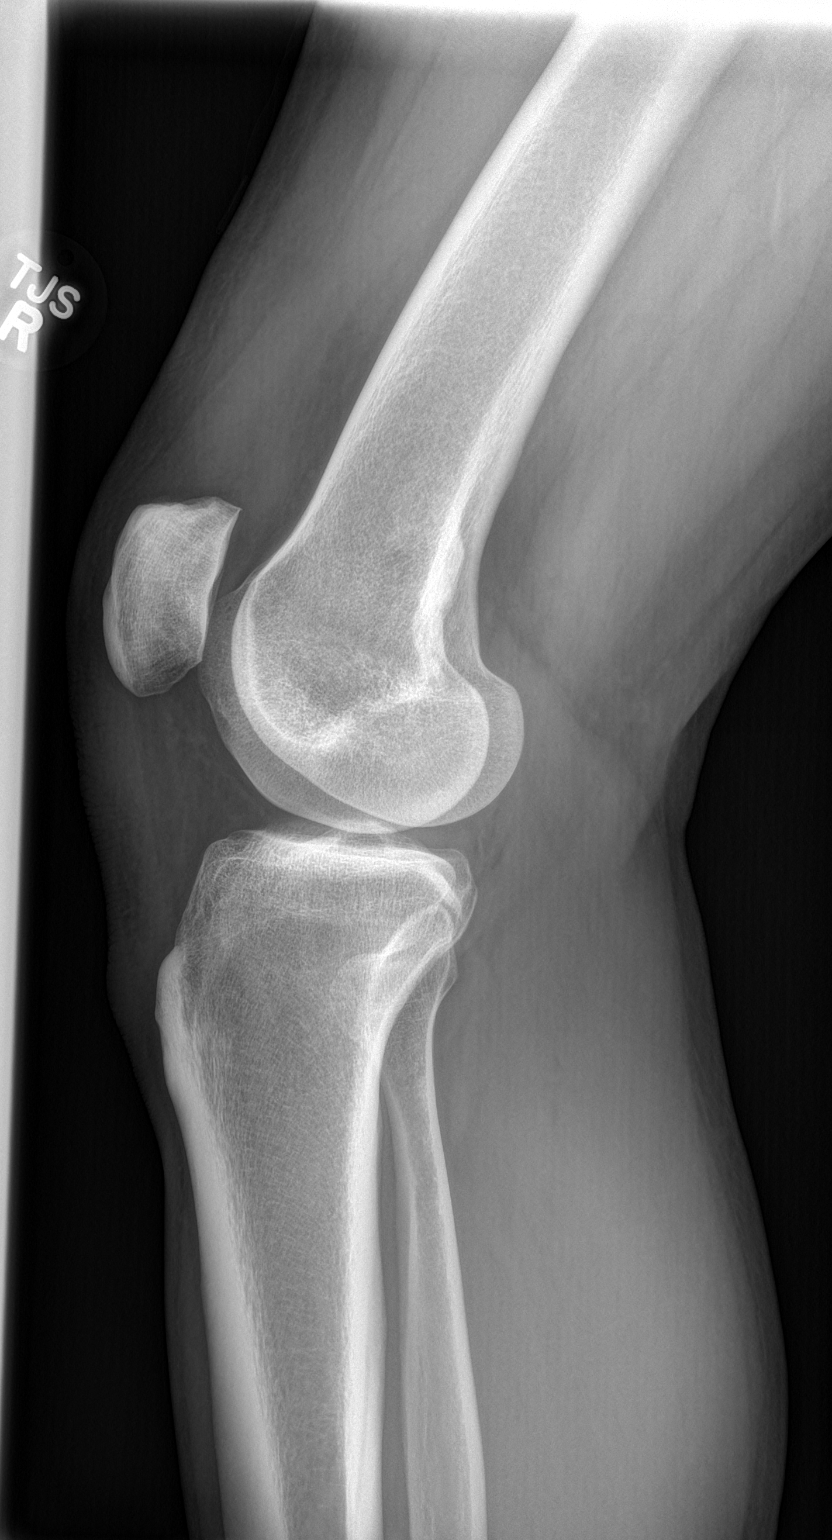
[im 3/4]
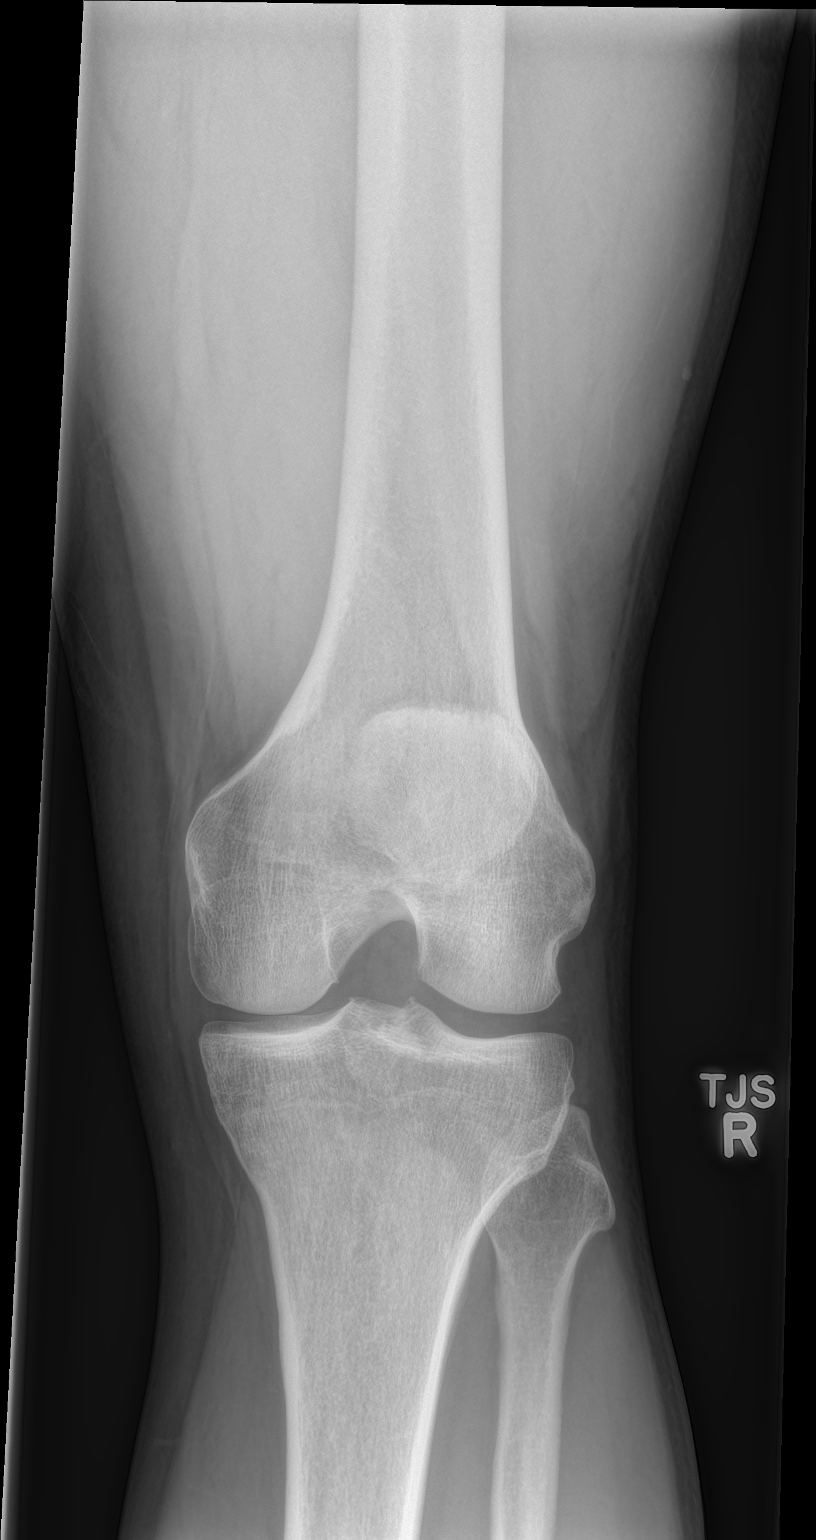
[im 4/4]
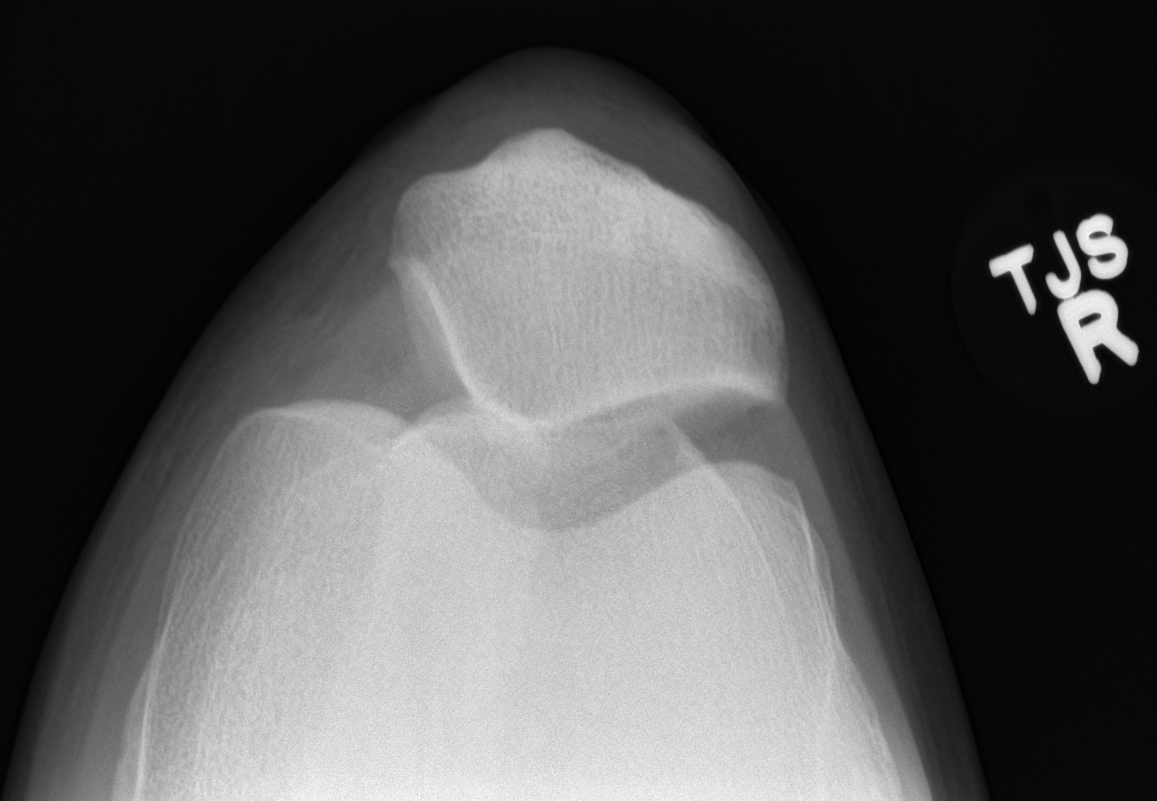

[4 of 4 positions shown; findings below may reference images not displayed]

FINDINGS: Osseous mineralization normal.

Joint spaces preserved.

No fracture, dislocation, or bone destruction.

No joint effusion.

Question mild anterior infrapatellar soft tissue swelling.
IMPRESSION: No acute osseous abnormalities.

## 2019-05-03 IMAGING — CR DG KNEE COMPLETE 4+V*L*
1 series · 4 of 4 positions shown · non-contrast
Comparison: None

CLINICAL DATA: Anterior and medial pain at both knees greater on
RIGHT, no known injury

EXAM:
LEFT KNEE - COMPLETE 4+ VIEW

[Series 1: dg knee complete 4 views left · 0.14mm/px · 4 of 4 slices shown]
[im 1/4]
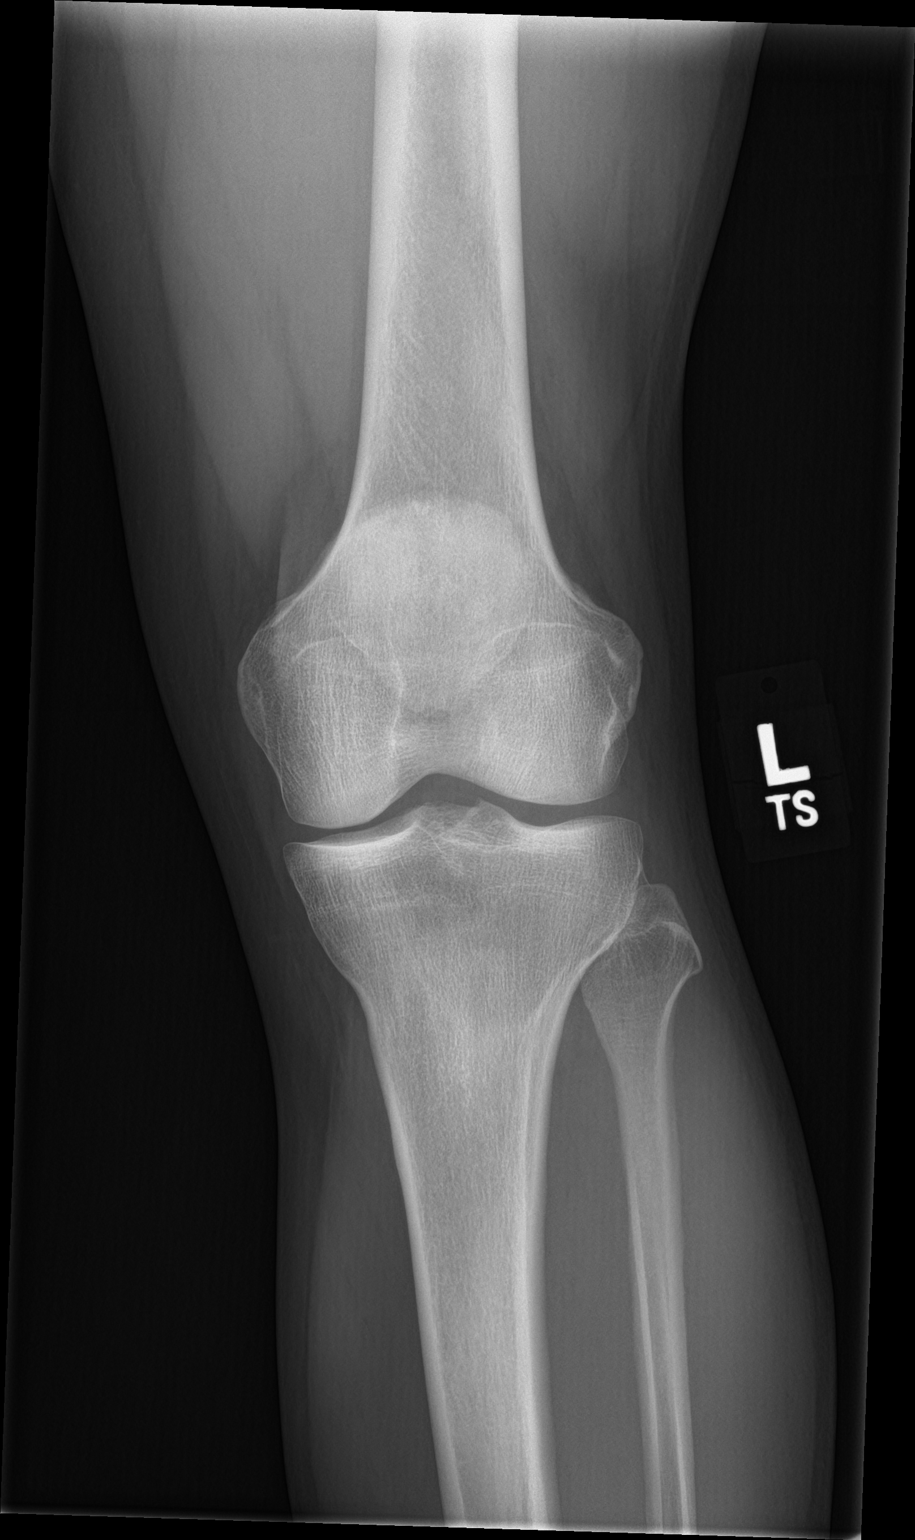
[im 2/4]
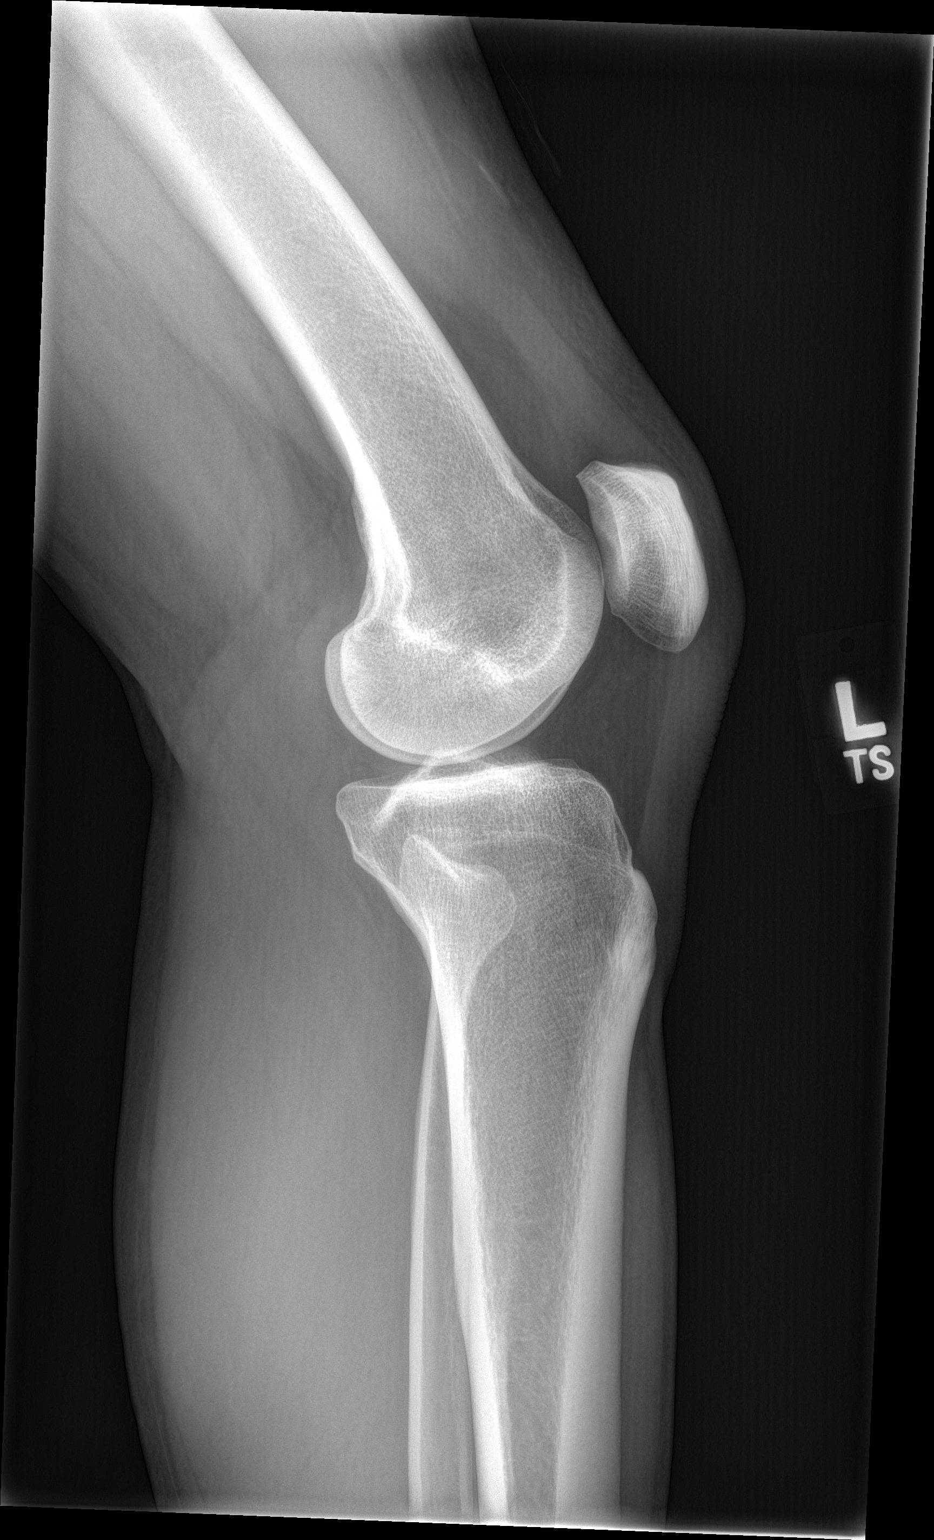
[im 3/4]
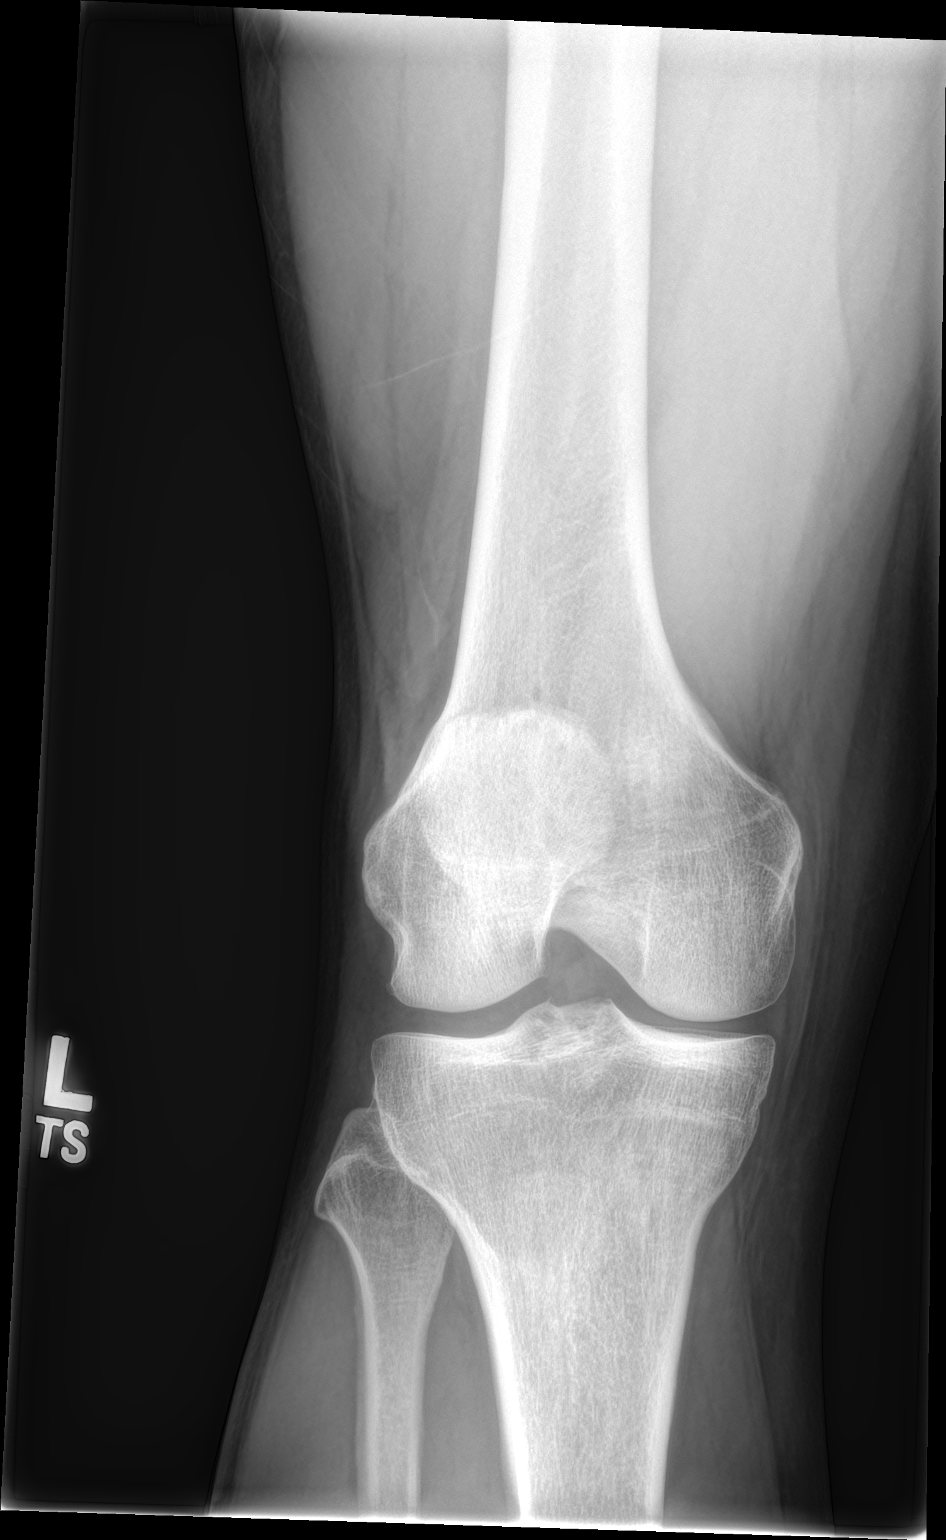
[im 4/4]
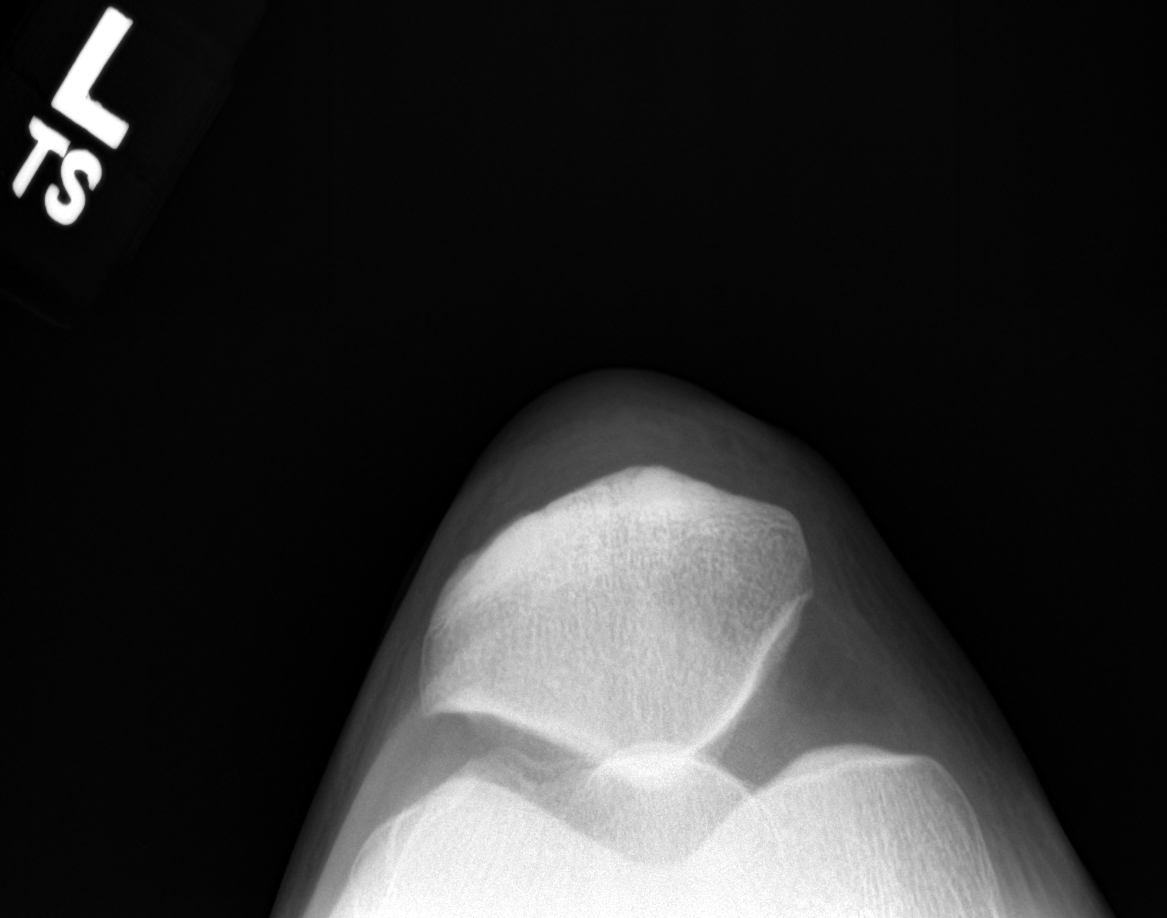

[4 of 4 positions shown; findings below may reference images not displayed]

FINDINGS: Osseous mineralization normal.

Minimal medial compartment joint space narrowing.

No acute fracture, dislocation, or bone destruction.

No knee joint effusion.
IMPRESSION: Minimal degenerative changes at medial compartment LEFT knee.

No acute abnormalities.

## 2019-05-22 ENCOUNTER — Other Ambulatory Visit: Payer: Self-pay

## 2019-05-22 ENCOUNTER — Ambulatory Visit: Payer: 59 | Admitting: Acute Care

## 2019-05-22 ENCOUNTER — Encounter: Payer: Self-pay | Admitting: Acute Care

## 2019-05-22 VITALS — BP 120/82 | HR 74 | Temp 98.0°F | Ht 72.0 in | Wt 264.6 lb

## 2019-05-22 DIAGNOSIS — G479 Sleep disorder, unspecified: Secondary | ICD-10-CM

## 2019-05-22 DIAGNOSIS — G4733 Obstructive sleep apnea (adult) (pediatric): Secondary | ICD-10-CM | POA: Diagnosis not present

## 2019-05-22 NOTE — Progress Notes (Signed)
History of Present Illness Shane Guerrero is a 39 y.o. male with OSA on CPAP. He is here for a sleep consult. He will be followed by Dr. Belia Heman.Previously followed by Dr. Sherryll Burger at [facility].   05/22/2019  Pt. Presents for consult for sleep. He has been using a CPAP machine he got from his brother. He has previously been managed by Dr. Sherryll Burger at [facility] for mild to moderate OSA. They recommended AutoPAP 5 to 20 cm H2O. There has been no monitoring of patient's sleep apnea in the last 2 years. He is buying his equipment himself. He does have the whole face mask. 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 Per the patient, his wife states the patient still has apnea and is snoring. The patient feels that he is not getting optimal benefit from therapy. He states he has been increasing the pressure setting over time, with no improvement in his sleep quality. He does not have anyone to monitor his CPAP use for effectiveness , so we do not have any idea of AHI, or types of apnea ( central vs obstructive). He knows there is not a card inserted. He is willing to get a sim card so that we can obtain some kind of a down Load.  Pt. States  He has the machine set on 18 cm H2O. He does not think there is an AutoSet mode.  He endorses daytime sleepiness. Falls asleep whenever he sits down and is still.  He does not have am headaches. He does have some cloudiness of thought.  He has gained 30 pounds over the last 2 years.  He is wearing his CPAP every night  He falls asleep within 5 minutes of laying down.  He sleeps until the alarm goes off in the morning, but his wife has to turn him on his side 2-3 times a night for snoring.    Test Results: Epworth score is 8  09/2016 Sleep [facility] Dr. Sherryll Burger >> Mild to moderate OSA>> cannot find specific AHI Recommended Auto set 5-20 cm pressure    CBC Latest Ref Rng & Units 02/16/2017 06/02/2016 09/24/2015    WBC 3.4 - 10.8 x10E3/uL 6.2 6.3 7.3  Hemoglobin 13.0 - 17.7 g/dL 67.8 93.8 10.1  Hematocrit 37.5 - 51.0 % 45.3 45.1 44.7  Platelets 150 - 379 x10E3/uL 295 281 301    BMP Latest Ref Rng & Units 06/02/2016 09/24/2015 09/07/2013  Glucose 65 - 99 mg/dL 93 85 -  BUN 6 - 20 mg/dL 11 12 17   Creatinine 0.76 - 1.27 mg/dL 7.51 1.0  BUN/Creat Ratio 9 - 20 11 15  -  Sodium 134 - 144 mmol/L 139 138 141  Potassium 3.5 - 5.2 mmol/L 4.5 4.4 4.9  Chloride 96 - 106 mmol/L 100 102 -  CO2 18 - 29 mmol/L 26 22 -  Calcium 8.7 - 10.2 mg/dL 9.5 9.4 -    BNP No results found for: BNP  ProBNP No results found for: PROBNP  PFT No results found for: FEV1PRE, FEV1POST, FVCPRE, FVCPOST, TLC, DLCOUNC, PREFEV1FVCRT, PSTFEV1FVCRT  No results found.   Past medical hx History reviewed. No pertinent past medical history.   Social History   Tobacco Use  . Smoking status: Never Smoker  . Smokeless tobacco: Never Used  Substance Use Topics  . Alcohol use: Yes    Alcohol/week: 0.0 standard drinks    Comment: occasionally  . Drug use: No  [name] reports that he has never smoked. He has never used smokeless tobacco. He reports current alcohol use. He reports that he does not use drugs.  Tobacco Cessation: Never smoker   Past surgical hx, Family hx, Social hx all reviewed.  Current Outpatient Medications on File Prior to Visit  Medication Sig  . amphetamine-dextroamphetamine (ADDERALL) 15 MG tablet Take 15 mg by mouth 2 (two) times daily.   . B Complex Vitamins (B COMPLEX 1 PO) Take by mouth.  Marland Kitchen buPROPion (WELLBUTRIN XL) 300 MG 24 hr tablet Take 1 tablet (300 mg total) by mouth daily.  Marland Kitchen FLUoxetine (PROZAC) 40 MG capsule Take 40 mg by mouth daily.  . naproxen (NAPROSYN) 500 MG tablet TAKE 1 TABLET BY MOUTH TWICE (2) DAILY WITH A MEAL   No current facility-administered medications on file prior to visit.     Allergies  Allergen Reactions  . Abilify [Aripiprazole] Other (See Comments)     Excessive hypersomnia and increased appetite    Review Of Systems:  Constitutional:   No  weight loss, night sweats,  Fevers, chills,+  fatigue, or  lassitude.  HEENT:   No headaches,  Difficulty swallowing,  Tooth/dental problems, or  Sore throat,                No sneezing, itching, ear ache, nasal congestion, post nasal drip,   CV:  No chest pain,  Orthopnea, PND, swelling in lower extremities, anasarca, dizziness, palpitations, syncope.   GI  No heartburn, indigestion, abdominal pain, nausea, vomiting, diarrhea, change in bowel habits, loss of appetite, bloody stools.   Resp: No shortness of breath with exertion or at rest.  No excess mucus, no productive cough,  No non-productive cough,  No coughing up of blood.  No change in color of mucus.  No wheezing.  No chest wall deformity  Skin: no rash or lesions.  GU: no dysuria, change in color of urine, no urgency or frequency.  No flank pain, no hematuria   MS:  No joint pain or swelling.  No decreased range of motion.  No back pain.  Psych:  No change in mood or affect. No depression or anxiety.  No memory loss.   Vital Signs BP 120/82 (BP Location: Left Arm, Cuff Size: Large)   Pulse 74   Temp 98 F (36.7 C) (Temporal)   Ht 6' (1.829 m)   Wt 264 lb 9.6 oz (120 kg)   SpO2 98%   BMI 35.89 kg/m    Physical Exam:  General- No distress,  A&Ox3, pleasant ENT: No sinus tenderness, TM clear, pale nasal mucosa, no oral exudate,no post nasal drip, no LAN Cardiac: S1, S2, regular rate and rhythm, no murmur Chest: No wheeze/ rales/ dullness; no accessory muscle use, no nasal flaring, no sternal retractions Abd.: Soft Non-tender, ND, BS +, Body mass index is 35.89 kg/m. Ext: No clubbing cyanosis, edema Neuro:  normal strength, MAE x 4, A&O x 3 Skin: No rashes, No lesions,  warm and dry, brisk refill Psych: normal mood and behavior   Assessment/Plan OSA on CPAP Uses a CPAP machine that is not monitored No sim card We will  order a split night CPAP study We will call you to  discuss the results with a telephone visit.  Please purchase a sim card for your machine so that we can retrieve data from the device.  Continue on CPAP at bedtime.  Goal is to wear for at least 6 hours each night for maximal clinical  benefit. Continue to work on weight loss, as the link between excess weight  and sleep apnea is well established.   Remember to establish a good bedtime routine, and work on sleep hygiene.  Limit daytime naps , avoid stimulants such as caffeine and nicotine close to bedtime, exercise daily to promote sleep quality, avoid heavy , spicy, fried , or rich foods before bed. Ensure adequate exposure to natural light during the day,establish a relaxing bedtime routine with a pleasant sleep environment ( Bedroom between 60 and 67 degrees, turn off bright lights , TV or device screens screens , consider black out curtains or white noise machines) Do not drive if sleepy. Remember to clean mask, tubing, filter, and reservoir once weekly with soapy water.  Follow up with  Dr. Belia Heman or Maralyn Sago NP  after Sleep study has been completed. ( Tele visit ok if I am in [place]) Please contact office for sooner follow up if symptoms do not improve or worsen or seek emergency care    This appointment was 30 min long with over 50% of the time in direct face-to-face patient care, assessment, plan of care, and follow-up.    Bevelyn Ngo, NP 05/22/2019  3:55 PM

## 2019-05-22 NOTE — Patient Instructions (Addendum)
It is good to see you today.  We will order a split night CPAP study We will call you to  discuss the results with a telephone visit.  Please purchase a sim card for your machine so that we can retrieve data from the device.  Continue on CPAP at bedtime.  Goal is to wear for at least 6 hours each night for maximal clinical benefit. Continue to work on weight loss, as the link between excess weight  and sleep apnea is well established.   Remember to establish a good bedtime routine, and work on sleep hygiene.  Limit daytime naps , avoid stimulants such as caffeine and nicotine close to bedtime, exercise daily to promote sleep quality, avoid heavy , spicy, fried , or rich foods before bed. Ensure adequate exposure to natural light during the day,establish a relaxing bedtime routine with a pleasant sleep environment ( Bedroom between 60 and 67 degrees, turn off bright lights , TV or device screens screens , consider black out curtains or white noise machines) Do not drive if sleepy. Remember to clean mask, tubing, filter, and reservoir once weekly with soapy water.  Follow up with  Dr. Belia Heman or Maralyn Sago NP  after Sleep study has been completed. ( Tele visit ok if I am in [place]) Please contact office for sooner follow up if symptoms do not improve or worsen or seek emergency care

## 2019-06-13 ENCOUNTER — Telehealth: Payer: Self-pay | Admitting: Acute Care

## 2019-06-13 NOTE — Telephone Encounter (Signed)
Maralyn Sago would you like to do a peer to peer or order a HST?

## 2019-06-13 NOTE — Telephone Encounter (Signed)
Elon Jester at [facility] called and stated that insurance denied in lab study stating that patient did not meet criteria.  A Peer to Peer can be done by calling [phone] or patient can have HST.  Please advise.  Rhonda J Cobb

## 2019-06-14 NOTE — Telephone Encounter (Signed)
Left message for patient to call back so he is aware of the change.

## 2019-06-14 NOTE — Telephone Encounter (Signed)
Let's see if they will approve a Home Sleep Study. Thanks so  Much.

## 2019-06-15 ENCOUNTER — Telehealth: Payer: Self-pay | Admitting: Acute Care

## 2019-06-15 DIAGNOSIS — G4733 Obstructive sleep apnea (adult) (pediatric): Secondary | ICD-10-CM

## 2019-06-15 NOTE — Telephone Encounter (Signed)
Attempted to call pt but unable to reach. Left message for him to return call x2. 

## 2019-06-15 NOTE — Telephone Encounter (Signed)
I have cancelled the split night and ordered HST

## 2019-06-15 NOTE — Telephone Encounter (Signed)
See previous phone note that [facility] had denied in lab sleep study.  HST has been approved.  Will need an order for a HST and in lab study canceled.  Rhonda J Cobb

## 2019-06-16 ENCOUNTER — Other Ambulatory Visit: Payer: 59

## 2019-07-10 ENCOUNTER — Telehealth: Payer: Self-pay | Admitting: Acute Care

## 2019-07-10 NOTE — Telephone Encounter (Signed)
Pt contacted and HST scheduled for Wed 07/12/2019 at 4:00 at [facility] in [place]. Nothing else needed at this time. Rhonda J Cobb

## 2019-07-11 ENCOUNTER — Encounter: Payer: Self-pay | Admitting: Family Medicine

## 2019-07-12 ENCOUNTER — Other Ambulatory Visit: Payer: Self-pay

## 2019-07-12 ENCOUNTER — Ambulatory Visit: Payer: 59

## 2019-07-12 DIAGNOSIS — G4733 Obstructive sleep apnea (adult) (pediatric): Secondary | ICD-10-CM | POA: Diagnosis not present

## 2019-07-20 ENCOUNTER — Other Ambulatory Visit: Payer: Self-pay

## 2019-07-20 ENCOUNTER — Ambulatory Visit (INDEPENDENT_AMBULATORY_CARE_PROVIDER_SITE_OTHER): Payer: 59 | Admitting: Adult Health

## 2019-07-20 ENCOUNTER — Encounter: Payer: Self-pay | Admitting: Adult Health

## 2019-07-20 DIAGNOSIS — G4733 Obstructive sleep apnea (adult) (pediatric): Secondary | ICD-10-CM | POA: Diagnosis not present

## 2019-07-20 NOTE — Progress Notes (Signed)
Virtual Visit via Telephone Note  I connected with Shane Guerrero on 07/20/19 at 10:30 AM EDT by telephone and verified that I am speaking with the correct person using two identifiers.  Location: Patient: Home  Provider: Home    I discussed the limitations, risks, security and privacy concerns of performing an evaluation and management service by telephone and the availability of in person appointments. I also discussed with the patient that there may be a patient responsible charge related to this service. The patient expressed understanding and agreed to proceed.   History of Present Illness: 39 year old male seen for sleep consult May 22, 2019 for sleep apnea. Patient had previously been diagnosed with mild to moderate sleep apnea in 2018.  He had gotten a CPAP machine from a friend and was using it in which he believes a set pressure is at 18 cm H2O.  However continues to feel tired and snores while wearing it.  Patient was recommended to get a SIM card so we can do a CPAP download.  He says he has obtained this and has been using it for the last 3 weeks.  Patient says he wears his CPAP every night for about 6 hours.  But still feels tired and sleepy during the daytime.  Patient was set up for a split-night sleep study.  However insurance would not cover.  And required him to have a home sleep study.  This was done on July 12, 2019 and showed moderate sleep apnea with AHI of 18.7 and SPO2 low at 81%.  I went over his sleep study results.  I recommend that he get a new CPAP machine.  However patient would like to keep his current machine and get a CPAP download to see if his pressures need to be adjusted.  Patient Active Problem List   Diagnosis Date Noted  . Chronic knee pain 07/15/2017  . Bronchitis 08/08/2015  . Bursitis of knee 04/10/2015  . Clinical depression 04/10/2015  . 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 Migraine without aura and responsive to treatment 04/28/2008   Current Outpatient  Medications on File Prior to Visit  Medication Sig Dispense Refill  . amphetamine-dextroamphetamine (ADDERALL) 15 MG tablet Take 15 mg by mouth 2 (two) times daily.     . B Complex Vitamins (B COMPLEX 1 PO) Take by mouth.    Marland Kitchen buPROPion (WELLBUTRIN XL) 300 MG 24 hr tablet Take 1 tablet (300 mg total) by mouth daily. 30 tablet 11  . FLUoxetine (PROZAC) 40 MG capsule Take 40 mg by mouth daily.    . naproxen (NAPROSYN) 500 MG tablet TAKE 1 TABLET BY MOUTH TWICE (2) DAILY WITH A MEAL 60 tablet 4   No current facility-administered medications on file prior to visit.      Observations/Objective: Speaks in full sentences with no audible distress  Assessment and Plan: Moderate obstructive sleep apnea.  We went over patient education on sleep apnea and treatment options including weight loss, oral appliance and CPAP.  He would like to continue on CPAP.  I have recommended a new CPAP machine so that we can monitor his sleep apnea more closely.  However he has a machine and does not want to have to buy a new one.  He does have a SD card and will bring this by our office for a CPAP download.  Plan  Patient Instructions  Please bring your SD card by the office for a CPAP download Continue on CPAP at bedtime Wear your CPAP every  night for at least 6 hours or more Work on healthy weight loss Do not drive if sleepy Follow-up with Dr. Belia Heman in 3 months and As needed         Follow Up Instructions: Follow up in 3 months and As needed     I discussed the assessment and treatment plan with the patient. The patient was provided an opportunity to ask questions and all were answered. The patient agreed with the plan and demonstrated an understanding of the instructions.   The patient was advised to call back or seek an in-person evaluation if the symptoms worsen or if the condition fails to improve as anticipated.  I provided 22  minutes of non-face-to-face time during this encounter.   Rubye Oaks, NP

## 2019-07-20 NOTE — Patient Instructions (Signed)
Please bring your SD card by the office for a CPAP download Continue on CPAP at bedtime Wear your CPAP every night for at least 6 hours or more Work on healthy weight loss Do not drive if sleepy Follow-up with Dr. Belia Heman in 3 months and As needed

## 2019-07-24 NOTE — Progress Notes (Signed)
Established patient visit  I,April Miller,acting as a scribe for Megan Mans, MD.,have documented all relevant documentation on the behalf of Megan Mans, MD,as directed by  Megan Mans, MD while in the presence of Megan Mans, MD.   Patient: Shane Guerrero   DOB: 1980/05/25   39 y.o. Male  MRN: [id number] Visit Date: 07/25/2019  Today's healthcare provider: Megan Mans, MD   No chief complaint on file.  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 he was giving blood was told that he tested positive for hepatitis B.  He denies any lifestyle of drug use or homosexual behavior or sex with prostitutes or multiple partners. Other words he has low risk for hepatitis. He has chronic fatigue but has been struggling with his depression for a long time.  He is followed by psychiatry.  He is not suicidal or homicidal.     Medications: Outpatient Medications Prior to Visit  Medication Sig   amphetamine-dextroamphetamine (ADDERALL) 15 MG tablet Take 15 mg by mouth 2 (two) times daily.    B Complex Vitamins (B COMPLEX 1 PO) Take by mouth.   FLUoxetine (PROZAC) 40 MG capsule Take 40 mg by mouth daily.   naproxen (NAPROSYN) 500 MG tablet TAKE 1 TABLET BY MOUTH TWICE (2) DAILY WITH A MEAL   topiramate (TOPAMAX) 50 MG tablet Take 50 mg by mouth daily.   buPROPion (WELLBUTRIN XL) 300 MG 24 hr tablet Take 1 tablet (300 mg total) by mouth daily. (Patient not taking: Reported on 07/25/2019)   No facility-administered medications prior to visit.    Review of Systems  Constitutional: Positive for fatigue. Negative for appetite change, chills and fever.  Respiratory: Negative for chest tightness, shortness of breath and wheezing.   Cardiovascular: Negative for chest pain and palpitations.  Gastrointestinal: Negative for abdominal pain, nausea and vomiting.  Allergic/Immunologic: Negative.   Psychiatric/Behavioral: Negative.         Objective    BP 110/76 (BP Location: Right Arm, Patient Position: Sitting, Cuff Size: Large)    Pulse 68    Temp (!) 97.5 F (36.4 C) (Other (Comment))    Resp 16    Ht 6' (1.829 m)    Wt 258 lb (117 kg)    SpO2 97%    BMI 34.99 kg/m  BP Readings from Last 3 Encounters:  07/25/19 110/76  05/22/19 120/82  03/21/19 109/79   Wt Readings from Last 3 Encounters:  07/25/19 258 lb (117 kg)  05/22/19 264 lb 9.6 oz (120 kg)  01/17/19 273 lb (123.8 kg)      Physical Exam Vitals reviewed.  Constitutional:      Appearance: He is obese.  HENT:     Head: Normocephalic and atraumatic.     Right Ear: External ear normal.     Left Ear: External ear normal.  Eyes:     General: No scleral icterus.    Conjunctiva/sclera: Conjunctivae normal.  Cardiovascular:     Rate and Rhythm: Normal rate and regular rhythm.     Heart sounds: Normal heart sounds.  Pulmonary:     Effort: Pulmonary effort is normal.     Breath sounds: Normal breath sounds.  Abdominal:     Palpations: Abdomen is soft. There is no mass.     Tenderness: There is no abdominal tenderness.  Musculoskeletal:     Right lower leg: No edema.     Left lower leg:  No edema.  Skin:    General: Skin is warm and dry.  Neurological:     General: No focal deficit present.     Mental Status: He is alert and oriented to person, place, and time.  Psychiatric:        Behavior: Behavior normal.        Thought Content: Thought content normal.        Judgment: Judgment normal.       No results found for any visits on 07/25/19.  Assessment & Plan     1. Fatigue, unspecified type Patient has long history of depression and I think this is the source of the fatigue.  He has been followed by psychiatry. - Hepatitis B Surface AntiGEN - CBC w/Diff/Platelet - Comprehensive Metabolic Panel (CMET) - TSH  2. OSA (obstructive sleep apnea) He is undergoing evaluation for sleep apnea.  I think this also contributes to his  fatigue. - Hepatitis B Surface AntiGEN - CBC w/Diff/Platelet - Comprehensive Metabolic Panel (CMET) - TSH  3. Moderate episode of recurrent major depressive disorder ([place]) Treated by psychiatry regularly. - Hepatitis B Surface AntiGEN - CBC w/Diff/Platelet - Comprehensive Metabolic Panel (CMET) - TSH  4. Need for hepatitis B screening test If this is negative he may actually need screening for hepatitis C and A  - Hepatitis B Surface AntiGEN   No follow-ups on file.      I, Wilhemena Durie, MD, have reviewed all documentation for this visit. The documentation on 07/28/19 for the exam, diagnosis, procedures, and orders are all accurate and complete.    Taji Barretto Cranford Mon, MD  [facility] [phone] (phone) [phone] (fax)  [employer]

## 2019-07-25 ENCOUNTER — Encounter: Payer: Self-pay | Admitting: Family Medicine

## 2019-07-25 ENCOUNTER — Ambulatory Visit: Payer: 59 | Admitting: Family Medicine

## 2019-07-25 ENCOUNTER — Other Ambulatory Visit: Payer: Self-pay

## 2019-07-25 VITALS — BP 110/76 | HR 68 | Temp 97.5°F | Resp 16 | Ht 72.0 in | Wt 258.0 lb

## 2019-07-25 DIAGNOSIS — F331 Major depressive disorder, recurrent, moderate: Secondary | ICD-10-CM | POA: Diagnosis not present

## 2019-07-25 DIAGNOSIS — G4733 Obstructive sleep apnea (adult) (pediatric): Secondary | ICD-10-CM

## 2019-07-25 DIAGNOSIS — Z1159 Encounter for screening for other viral diseases: Secondary | ICD-10-CM

## 2019-07-25 DIAGNOSIS — R5383 Other fatigue: Secondary | ICD-10-CM | POA: Diagnosis not present

## 2019-07-26 ENCOUNTER — Telehealth: Payer: Self-pay

## 2019-07-26 LAB — COMPREHENSIVE METABOLIC PANEL
ALT: 58 IU/L — ABNORMAL HIGH (ref 0–44)
AST: 41 IU/L — ABNORMAL HIGH (ref 0–40)
Albumin/Globulin Ratio: 2 (ref 1.2–2.2)
Albumin: 4.2 g/dL (ref 4.0–5.0)
Alkaline Phosphatase: 154 IU/L — ABNORMAL HIGH (ref 48–121)
BUN/Creatinine Ratio: 20 (ref 9–20)
BUN: 20 mg/dL (ref 6–20)
Bilirubin Total: 0.2 mg/dL (ref 0.0–1.2)
CO2: 21 mmol/L (ref 20–29)
Calcium: 9.3 mg/dL (ref 8.7–10.2)
Chloride: 106 mmol/L (ref 96–106)
Creatinine, Ser: 0.98 mg/dL (ref 0.76–1.27)
GFR calc Af Amer: 113 mL/min/{1.73_m2} (ref >59)
GFR calc non Af Amer: 97 mL/min/{1.73_m2} (ref >59)
Globulin, Total: 2.1 g/dL (ref 1.5–4.5)
Glucose: 112 mg/dL — ABNORMAL HIGH (ref 65–99)
Potassium: 4.3 mmol/L (ref 3.5–5.2)
Sodium: 140 mmol/L (ref 134–144)
Total Protein: 6.3 g/dL (ref 6.0–8.5)

## 2019-07-26 LAB — CBC WITH DIFFERENTIAL/PLATELET
Basophils Absolute: 0.1 10*3/uL (ref 0.0–0.2)
Basos: 1 %
EOS (ABSOLUTE): 0.4 10*3/uL (ref 0.0–0.4)
Eos: 6 %
Hematocrit: 42.7 % (ref 37.5–51.0)
Hemoglobin: 14.3 g/dL (ref 13.0–17.7)
Immature Grans (Abs): 0 10*3/uL (ref 0.0–0.1)
Immature Granulocytes: 1 %
Lymphocytes Absolute: 2.3 10*3/uL (ref 0.7–3.1)
Lymphs: 31 %
MCH: 27 pg (ref 26.6–33.0)
MCHC: 33.5 g/dL (ref 31.5–35.7)
MCV: 81 fL (ref 79–97)
Monocytes Absolute: 0.8 10*3/uL (ref 0.1–0.9)
Monocytes: 11 %
Neutrophils Absolute: 3.9 10*3/uL (ref 1.4–7.0)
Neutrophils: 50 %
Platelets: 332 10*3/uL (ref 150–450)
RBC: 5.3 x10E6/uL (ref 4.14–5.80)
RDW: 13.1 % (ref 11.6–15.4)
WBC: 7.5 10*3/uL (ref 3.4–10.8)

## 2019-07-26 LAB — TSH: TSH: 1.1 u[IU]/mL (ref 0.450–4.500)

## 2019-07-26 LAB — HEPATITIS B SURFACE ANTIGEN: Hepatitis B Surface Ag: NEGATIVE

## 2019-07-26 NOTE — Telephone Encounter (Signed)
Pt dropped off SD card for download. Data on card ended 11/2011.  Spoke to pt, who stated that he wears his machine every night, therefore he is unsure as to why there is not more recent data.  Download has been faxed to Rubye Oaks, NP for review.   Will route to Tammy as an [place].

## 2019-07-27 NOTE — Telephone Encounter (Signed)
Patient has ordered an SD card from [place] and he will bring it to our office after 30 days of use. Nothing further needed at this time.

## 2019-07-27 NOTE — Telephone Encounter (Signed)
SD cause must be bad please reach out to homecare company and get new SD  card and tell him to drop it off after he is used it for 30 days  If he does not use a homecare company tell him that he can order one on [email or web address] online that matches his CPAP machine

## 2019-07-31 ENCOUNTER — Telehealth: Payer: Self-pay

## 2019-07-31 NOTE — Telephone Encounter (Signed)
Patient advised of lab results and expressed verbal understanding. 

## 2019-07-31 NOTE — Telephone Encounter (Signed)
-----   Message from Maple Hudson., MD sent at 07/28/2019 10:19 AM EDT ----- No hepatitis B.  Mild elevations of liver function which are probably due to fatty liver.  Work on diet and exercise and weight loss.  Follow-up in a couple of months.  We will repeat labs then.

## 2019-08-07 DIAGNOSIS — G4733 Obstructive sleep apnea (adult) (pediatric): Secondary | ICD-10-CM

## 2019-09-08 ENCOUNTER — Other Ambulatory Visit: Payer: Self-pay | Admitting: Family Medicine

## 2019-09-08 DIAGNOSIS — M25562 Pain in left knee: Secondary | ICD-10-CM

## 2019-09-08 DIAGNOSIS — G8929 Other chronic pain: Secondary | ICD-10-CM

## 2019-09-12 ENCOUNTER — Telehealth: Payer: Self-pay | Admitting: Adult Health

## 2019-09-12 NOTE — Telephone Encounter (Signed)
Left message for patient

## 2019-09-13 NOTE — Telephone Encounter (Signed)
Left message for patient

## 2019-09-14 NOTE — Telephone Encounter (Signed)
Patient is aware that he can bring SD card by for download anytime between 8-5 M-F. Patient stated that he will come by tomorrow.  Appointment scheduled for 10/16/2019 at 11;30. Nothing further is needed.

## 2019-10-16 ENCOUNTER — Other Ambulatory Visit: Payer: Self-pay

## 2019-10-16 ENCOUNTER — Encounter: Payer: Self-pay | Admitting: Pulmonary Disease

## 2019-10-16 ENCOUNTER — Ambulatory Visit (INDEPENDENT_AMBULATORY_CARE_PROVIDER_SITE_OTHER): Payer: 59 | Admitting: Pulmonary Disease

## 2019-10-16 DIAGNOSIS — G4733 Obstructive sleep apnea (adult) (pediatric): Secondary | ICD-10-CM | POA: Diagnosis not present

## 2019-10-16 DIAGNOSIS — S0180XA Unspecified open wound of other part of head, initial encounter: Secondary | ICD-10-CM

## 2019-10-16 MED ORDER — DOXYCYCLINE HYCLATE 100 MG PO TABS: 100.0000 mg | ORAL_TABLET | Freq: Two times a day (BID) | ORAL | 0 refills | Status: AC

## 2019-10-16 NOTE — Progress Notes (Signed)
@Patient  ID: , male    DOB: [birth year], 39 y.o.   MRN: [id number]  Chief Complaint  Patient presents with  . Follow-up    mask rubbing nose, sound of machine is not consistant.      Referring provider: [id number].,*  HPI:  39 year old male never smoker followed in our office for moderate obstructive sleep apnea  PMH: Depression, knee pain, migraine Smoker/ Smoking History: Never Smoker Maintenance:   Pt of: Dr. 24  10/16/2019  - Visit   39 year old male never smoker followed in our office for moderate obstructive sleep apnea.  Patient's most recent home sleep study on 07/12/2019 had the following results:  07/12/2019-home sleep study-AHI 18.7, SaO2 low 82%  These results were reviewed with patient on 07/20/2019 which was a telephone clinic visit with TP NP.  It is recommended that patient come by our office and bring his SD card so we can obtain a download.  And he was requested have a follow-up in 3 months.  He is presenting today for 51-month follow-up.  Patient presenting to office today reporting that he is currently using a friend's parents old CPAP machine.  He reports this was given to him.  He is unsure how old it is.  He has purchased an SD card.  Unfortunately this did not work.  He is not established with a DME company.  He is not sure if the CPAP is adequately controlling his sleep apnea.  His snoring does improve when wearing it.  Unfortunately patient is wearing the wrong size mask.  He is using a full facemask.  Its causing significant skin breakdown on his nasal bridge.  He currently has an open wound.  Does not have a Band-Aid on it.  He reports that this is worsened over the last 2 weeks.  Spouse who is in the office with the patient reports that this is been an ongoing issue that is cyclical that slowly improves and then breaks out again with use of CPAP.  He has not followed up with primary care regarding this.  He has not notified our office of  the symptoms.  We will discuss and evaluate this today.  Patient also sees a psychiatrist who is unsure whether or not the patient may need an overnight oximetry exam.  We will talk about this today.   Questionaires / Pulmonary Flowsheets:   ACT:  No flowsheet data found.  MMRC: No flowsheet data found.  Epworth:  Results of the Epworth flowsheet 05/22/2019 03/21/2019 05/26/2016  Sitting and reading 2 1 2   Watching TV 2 2 3   Sitting, inactive in a public place (e.g. a theatre or a meeting) 1 0 1  As a passenger in a car for an hour without a break 0 1 0  Lying down to rest in the afternoon when circumstances permit 2 2 2   Sitting and talking to someone 0 0 0  Sitting quietly after a lunch without alcohol 1 1 1   In a car, while stopped for a few minutes in traffic 0 0 0  Total score 8 7 9     Tests:   FENO:  No results found for: NITRICOXIDE  PFT: No flowsheet data found.  WALK:  No flowsheet data found.  Imaging: No results found.  Lab Results:  CBC    Component Value Date/Time   WBC 7.5 07/25/2019 1536   RBC 5.30 07/25/2019 1536   HGB 14.3 07/25/2019 1536   HCT 42.7 07/25/2019  1536   PLT 332 07/25/2019 1536   MCV 81 07/25/2019 1536   MCH 27.0 07/25/2019 1536   MCHC 33.5 07/25/2019 1536   RDW 13.1 07/25/2019 1536   LYMPHSABS 2.3 07/25/2019 1536   EOSABS 0.4 07/25/2019 1536   BASOSABS 0.1 07/25/2019 1536    BMET    Component Value Date/Time   NA 140 07/25/2019 1536   K 4.3 07/25/2019 1536   CL 106 07/25/2019 1536   CO2 21 07/25/2019 1536   GLUCOSE 112 (H) 07/25/2019 1536   BUN 20 07/25/2019 1536   CREATININE 0.98 07/25/2019 1536   CALCIUM 9.3 07/25/2019 1536   GFRNONAA 97 07/25/2019 1536   GFRAA 113 07/25/2019 1536    BNP No results found for: BNP  ProBNP No results found for: PROBNP  Specialty Problems      Pulmonary Problems   Bronchitis   Obstructive sleep apnea      Allergies  Allergen Reactions  . Abilify [Aripiprazole] Other  (See Comments)    Excessive hypersomnia and increased appetite    Immunization History  Administered Date(s) Administered  . Influenza,inj,Quad PF,6+ Mos 01/20/2016, 03/09/2017, 11/21/2018  . Moderna SARS-COVID-2 Vaccination 05/25/2019, 06/22/2019  . Tdap 08/16/2013    History reviewed. No pertinent past medical history.  Tobacco History: Social History   Tobacco Use  Smoking Status Never Smoker  Smokeless Tobacco Never Used   Counseling given: Not Answered   Continue to not smoke  Outpatient Encounter Medications as of 10/16/2019  Medication Sig  . amphetamine-dextroamphetamine (ADDERALL) 15 MG tablet Take 15 mg by mouth 2 (two) times daily.   . B Complex Vitamins (B COMPLEX 1 PO) Take by mouth.  . clonazePAM (KLONOPIN) 0.5 MG tablet Take 0.5 mg by mouth 2 (two) times daily as needed.  Marland Kitchen FLUoxetine (PROZAC) 40 MG capsule Take 40 mg by mouth daily.  . naproxen (NAPROSYN) 500 MG tablet TAKE 1 TABLET BY MOUTH TWICE (2) DAILY WITH A MEAL  . topiramate (TOPAMAX) 50 MG tablet Take 25 mg by mouth daily.   Marland Kitchen doxycycline (VIBRA-TABS) 100 MG tablet Take 1 tablet (100 mg total) by mouth 2 (two) times daily.  . [DISCONTINUED] buPROPion (WELLBUTRIN XL) 300 MG 24 hr tablet Take 1 tablet (300 mg total) by mouth daily. (Patient not taking: Reported on 07/25/2019)   No facility-administered encounter medications on file as of 10/16/2019.     Review of Systems  Review of Systems  Constitutional: Positive for fatigue. Negative for activity change, chills, fever and unexpected weight change.  HENT: Negative for postnasal drip, rhinorrhea, sinus pressure, sinus pain and sore throat.   Eyes: Negative.   Respiratory: Negative for cough, shortness of breath and wheezing.   Cardiovascular: Negative for chest pain and palpitations.  Gastrointestinal: Negative for constipation, diarrhea, nausea and vomiting.  Endocrine: Negative.   Genitourinary: Negative.   Musculoskeletal: Negative.   Skin:  Positive for wound.  Neurological: Negative for dizziness and headaches.  Psychiatric/Behavioral: Negative.  Negative for dysphoric mood. The patient is not nervous/anxious.   All other systems reviewed and are negative.    Physical Exam  BP 110/86 (BP Location: Left Arm, Patient Position: Sitting, Cuff Size: Large)   Pulse 80   Temp 97.8 F (36.6 C) (Temporal)   Ht 6' (1.829 m)   Wt 238 lb (108 kg)   SpO2 99%   BMI 32.28 kg/m   Wt Readings from Last 5 Encounters:  10/16/19 238 lb (108 kg)  07/25/19 258 lb (117 kg)  05/22/19  264 lb 9.6 oz (120 kg)  01/17/19 273 lb (123.8 kg)  08/12/17 240 lb (108.9 kg)    BMI Readings from Last 5 Encounters:  10/16/19 32.28 kg/m  07/25/19 34.99 kg/m  05/22/19 35.89 kg/m  01/17/19 36.02 kg/m  08/12/17 31.66 kg/m     Physical Exam Vitals and nursing note reviewed.  Constitutional:      General: He is not in acute distress.    Appearance: Normal appearance. He is obese.  HENT:     Head: Normocephalic and atraumatic.      Right Ear: Hearing and external ear normal.     Left Ear: Hearing and external ear normal.     Nose: Signs of injury present. No mucosal edema.     Right Turbinates: Not enlarged.     Left Turbinates: Not enlarged.   Eyes:     Pupils: Pupils are equal, round, and reactive to light.  Cardiovascular:     Rate and Rhythm: Normal rate and regular rhythm.     Pulses: Normal pulses.     Heart sounds: Normal heart sounds. No murmur heard.   Pulmonary:     Effort: Pulmonary effort is normal.     Breath sounds: Normal breath sounds. No decreased breath sounds, wheezing or rales.  Musculoskeletal:     Cervical back: Normal range of motion.     Right lower leg: No edema.     Left lower leg: No edema.  Lymphadenopathy:     Cervical: No cervical adenopathy.  Skin:    General: Skin is warm and dry.     Capillary Refill: Capillary refill takes less than 2 seconds.     Findings: No erythema or rash.    Neurological:     General: No focal deficit present.     Mental Status: He is alert and oriented to person, place, and time.     Motor: No weakness.     Coordination: Coordination normal.     Gait: Gait is intact. Gait normal.  Psychiatric:        Mood and Affect: Mood normal.        Behavior: Behavior normal. Behavior is cooperative.        Thought Content: Thought content normal.        Judgment: Judgment normal.       Assessment & Plan:   Obstructive sleep apnea Unable to fully assess patient's obstructive sleep apnea controlled since we do not have a CPAP compliance Patient reports that he has poor insurance coverage and a high deductible which has been a barrier for him in his care He is not established with a DME company He is unsure how old his CPAP is, he received this from a friend whose father had the CPAP and has now passed  Plan: Established with [facility] Be evaluated by [facility] for mask fitting as well as to have CPAP evaluated to see if patient could be a candidate for [facility] Do not use CPAP for the next 7 days to allow better healing of the nasal bridge wound Follow-up in 6 to 8 weeks with Dr. Belia Heman  Open facial wound Open wound on nasal bridge likely due to full facemask of CPAP that is the wrong size Patient also wears glasses which continues to irritate the wound  Plan: Doxycycline today Encourage patient to continue Neosporin and wearing a Band-Aid over the wound to allow for skin closure Do not use CPAP for the next 7 days Follow-up with primary  care if symptoms or not improving    Return in about 2 months (around 12/16/2019), or if symptoms worsen or fail to improve[facility] Dr. Belia HemanKasa.   Coral CeoBrian P Arcenio Mullaly, NP 10/16/2019   This appointment required 32 minutes of patient care (this includes precharting, chart review, review of results, face-to-face care, etc.).

## 2019-10-16 NOTE — Assessment & Plan Note (Signed)
Unable to fully assess patient's obstructive sleep apnea controlled since we do not have a CPAP compliance Patient reports that he has poor insurance coverage and a high deductible which has been a barrier for him in his care He is not established with a DME company He is unsure how old his CPAP is, he received this from a friend whose father had the CPAP and has now passed  Plan: Established with [facility] Be evaluated by [facility] for mask fitting as well as to have CPAP evaluated to see if patient could be a candidate for [facility] Do not use CPAP for the next 7 days to allow better healing of the nasal bridge wound Follow-up in 6 to 8 weeks with Dr. Belia Heman

## 2019-10-16 NOTE — Assessment & Plan Note (Signed)
Open wound on nasal bridge likely due to full facemask of CPAP that is the wrong size Patient also wears glasses which continues to irritate the wound  Plan: Doxycycline today Encourage patient to continue Neosporin and wearing a Band-Aid over the wound to allow for skin closure Do not use CPAP for the next 7 days Follow-up with primary care if symptoms or not improving

## 2019-10-16 NOTE — Patient Instructions (Addendum)
You were seen today by Shane Ceo, NP  for:   1. Obstructive sleep apnea  We will need to get you set up with [facility] so they can evaluate your CPAP and see if this is a suitable machine for you to continue using They will also have to evaluate whether or not they can get you into [facility] so we can obtain basic information regarding your CPAP use  We will also refer you to [facility] for a mask fitting  Do not use your CPAP for the next 7 days to try to allow your nasal wound to heal   We recommend that you continue using your CPAP daily >>>Keep up the hard work using your device >>> Goal should be wearing this for the entire night that you are sleeping, at least 4 to 6 hours  Remember:  . Do not drive or operate heavy machinery if tired or drowsy.  . Please notify the supply company and office if you are unable to use your device regularly due to missing supplies or machine being broken.  . Work on maintaining a healthy weight and following your recommended nutrition plan  . Maintain proper daily exercise and movement  . Maintaining proper use of your device can also help improve management of other chronic illnesses such as: Blood pressure, blood sugars, and weight management.   BiPAP/ CPAP Cleaning:  >>>Clean weekly, with Dawn soap, and bottle brush.  Set up to air dry. >>> Wipe mask out daily with wet wipe or towelette    2. Open wound of face, initial encounter  - doxycycline (VIBRA-TABS) 100 MG tablet; Take 1 tablet (100 mg total) by mouth 2 (two) times daily.  Dispense: 14 tablet; Refill: 0  Continue to use Neosporin on the wound  Take antibiotics as prescribed  Follow-up with primary care if wound is not improving  I will route my note to primary care as FYI  Would wear Band-Aid while wearing glasses  Take 7 days off from using CPAP to allow your nasal bridge to heal, when resuming CPAP therapy please continue to wear a Band-Aid  We recommend today:   Meds  ordered this encounter  Medications  . doxycycline (VIBRA-TABS) 100 MG tablet    Sig: Take 1 tablet (100 mg total) by mouth 2 (two) times daily.    Dispense:  14 tablet    Refill:  0    Follow Up:    Return in about 2 months (around 12/16/2019), or if symptoms worsen or fail to improve, for [facility] - Dr. Belia Heman.  Next available if nothing available within the next 6 to 8 weeks  Notification of test results are managed in the following manner: If there are  any recommendations or changes to the  plan of care discussed in office today,  we will contact you and let you know what they are. If you do not hear from [place], then your results are normal and you can view them through your  MyChart account , or a letter will be sent to you. Thank you again for trusting [place] with your care  - Thank you, [facility]    It is flu season:   >>> Best ways to protect herself from the flu: Receive the yearly flu vaccine, practice good hand hygiene washing with soap and also using hand sanitizer when available, eat a nutritious meals, get adequate rest, hydrate appropriately       Please contact the office if  your symptoms worsen or you have concerns that you are not improving.   Thank you for choosing [facility] for your healthcare, and for allowing [place] to partner with you on your healthcare journey. I am thankful to be able to provide care to you today.   Elisha Headland FNP-C

## 2019-12-21 ENCOUNTER — Telehealth: Payer: Self-pay | Admitting: Internal Medicine

## 2019-12-21 ENCOUNTER — Telehealth: Payer: Self-pay

## 2019-12-21 ENCOUNTER — Encounter: Payer: Self-pay | Admitting: Internal Medicine

## 2019-12-21 DIAGNOSIS — G4733 Obstructive sleep apnea (adult) (pediatric): Secondary | ICD-10-CM

## 2019-12-21 NOTE — Telephone Encounter (Signed)
Called and spoke to patient's spouse, Lelia(DPR). Melton Krebs stated that patient needs new cpap, as his current machine is updated. She would like order for auto cpap to be sent to [facility].   Arlys John, please advise if okay to order and settings? Thanks

## 2019-12-21 NOTE — Telephone Encounter (Signed)
Left detailed message requesting that he bring SD card to 12/22/2019 OV.

## 2019-12-21 NOTE — Telephone Encounter (Signed)
Lm for patient.  

## 2019-12-21 NOTE — Telephone Encounter (Signed)
12/21/2019  Patient was last seen in our office in September/13/2021.  Patient was encouraged to establish with [facility].  At that time he was also treated due to likely cellulitis on the bridge of his nose.  From my understanding patient has a CPAP that he received from his friend.  We have not been able to check compliance on this.  His last home sleep study did show moderate obstructive sleep apnea.  Okay to place order for a new CPAP start: DME: [facility] APAP setting 5-15  Patient needs to have follow-up with our office in 2 months to show at least 30-day compliance of using CPAP therapy  Elisha Headland, FNP

## 2019-12-22 ENCOUNTER — Ambulatory Visit: Payer: 59 | Admitting: Internal Medicine

## 2019-12-22 NOTE — Telephone Encounter (Signed)
Patient is aware of below message and voiced his understanding.  Order has been placed to [facility] for new cpap machine.  Recall has been placed for two months, as providers scheduled are not currently open for January. Nothing further needed.

## 2020-01-09 ENCOUNTER — Encounter: Payer: Self-pay | Admitting: Family Medicine

## 2020-04-18 ENCOUNTER — Encounter: Payer: Self-pay | Admitting: Family Medicine

## 2020-04-18 NOTE — Progress Notes (Deleted)
Complete physical exam   Patient: Shane Guerrero   DOB: 01/06/1981   40 y.o. Male  MRN: [id number] Visit Date: 04/18/2020  Today's healthcare provider: Megan Mans, MD   No chief complaint on file.  Subjective    Shane Guerrero is a 40 y.o. male who presents today for a complete physical exam.  He reports consuming a {diet types:17450} diet. {Exercise:19826} He generally feels {well/fairly well/poorly:18703}. He reports sleeping {well/fairly well/poorly:18703}. He {does/does not:200015} have additional problems to discuss today.  HPI  ***  No past medical history on file. Past Surgical History:  Procedure Laterality Date  . SEPTOPLASTY     Social History   Socioeconomic History  . Marital status: Married    Spouse name: Not on file  . Number of children: Not on file  . Years of education: Not on file  . Highest education level: Not on file  Occupational History  . Not on file  Tobacco Use  . Smoking status: Never Smoker  . Smokeless tobacco: Never Used  Vaping Use  . Vaping Use: Never used  Substance and Sexual Activity  . Alcohol use: Yes    Alcohol/week: 0.0 standard drinks    Comment: occasionally  . Drug use: No  . Sexual activity: Not on file  Other Topics Concern  . Not on file  Social History Narrative  . Not on file   Social Determinants of Health   Financial Resource Strain: Not on file  Food Insecurity: Not on file  Transportation Needs: Not on file  Physical Activity: Not on file  Stress: Not on file  Social Connections: Not on file  Intimate Partner Violence: Not on file   Family Status  Relation Name Status  . Mother  Alive  . Father  Alive  . Brother 1 Alive  . Brother 2 Alive  . MGF  (Not Specified)   Family History  Problem Relation Age of Onset  . Healthy Mother   . Hyperlipidemia Father   . Healthy Brother   . Sleep apnea Brother   . Heart disease Maternal Grandfather   . Lung cancer Maternal Grandfather     Allergies  Allergen Reactions  . Abilify [Aripiprazole] Other (See Comments)    Excessive hypersomnia and increased appetite    Patient Care Team: Maple Hudson., MD as PCP - General (Family Medicine)   Medications: Outpatient Medications Prior to Visit  Medication Sig  . amphetamine-dextroamphetamine (ADDERALL) 15 MG tablet Take 15 mg by mouth 2 (two) times daily.   . B Complex Vitamins (B COMPLEX 1 PO) Take by mouth.  . clonazePAM (KLONOPIN) 0.5 MG tablet Take 0.5 mg by mouth 2 (two) times daily as needed.  . doxycycline (VIBRA-TABS) 100 MG tablet Take 1 tablet (100 mg total) by mouth 2 (two) times daily.  Marland Kitchen FLUoxetine (PROZAC) 40 MG capsule Take 40 mg by mouth daily.  . naproxen (NAPROSYN) 500 MG tablet TAKE 1 TABLET BY MOUTH TWICE (2) DAILY WITH A MEAL  . topiramate (TOPAMAX) 50 MG tablet Take 25 mg by mouth daily.    No facility-administered medications prior to visit.    Review of Systems  All other systems reviewed and are negative.   {Labs  Heme  Chem  Endocrine  Serology  Results Review (optional):23779::" "}  Objective    There were no vitals taken for this visit. {Show previous vital signs (optional):23777::" "}  Physical Exam  ***  Last depression screening scores PHQ 2/9  Scores 03/21/2019 03/21/2019 02/16/2017  PHQ - 2 Score 0 0 2  PHQ- 9 Score 6 - 9   Last fall risk screening Fall Risk  02/16/2017  Falls in the past year? No   Last Audit-C alcohol use screening No flowsheet data found. A score of 3 or more in women, and 4 or more in men indicates increased risk for alcohol abuse, EXCEPT if all of the points are from question 1   No results found for any visits on 04/18/20.  Assessment & Plan    Routine Health Maintenance and Physical Exam  Exercise Activities and Dietary recommendations Goals   None     Immunization History  Administered Date(s) Administered  . Influenza,inj,Quad PF,6+ Mos 01/20/2016, 03/09/2017, 11/21/2018  .  Moderna Sars-Covid-2 Vaccination 05/25/2019, 06/22/2019  . Tdap 08/16/2013    Health Maintenance  Topic Date Due  . Hepatitis C Screening  Never done  . INFLUENZA VACCINE  09/03/2019  . COVID-19 Vaccine (3 - Booster) 12/23/2019  . TETANUS/TDAP  08/17/2023  . HIV Screening  Completed  . HPV VACCINES  Aged Out    Discussed health benefits of physical activity, and encouraged him to engage in regular exercise appropriate for his age and condition.  ***  No follow-ups on file.     {provider attestation***:1}   Megan Mans, MD  [facility] [phone] (phone) [phone] (fax)  [facility]

## 2020-04-20 ENCOUNTER — Other Ambulatory Visit: Payer: Self-pay | Admitting: Family Medicine

## 2020-04-20 DIAGNOSIS — M25562 Pain in left knee: Secondary | ICD-10-CM

## 2020-04-20 DIAGNOSIS — G8929 Other chronic pain: Secondary | ICD-10-CM

## 2020-04-20 NOTE — Telephone Encounter (Signed)
Requested Prescriptions  Pending Prescriptions Disp Refills  . naproxen (NAPROSYN) 500 MG tablet [Pharmacy Med Name: NAPROXEN 500 MG TAB] 180 tablet 0    Sig: TAKE 1 TABLET BY MOUTH TWICE (2) DAILY WITH A MEAL     Analgesics:  NSAIDS Passed - 04/20/2020 11:45 AM      Passed - Cr in normal range and within 360 days    Creatinine, Ser  Date Value Ref Range Status  07/25/2019 0.98 0.76 - 1.27 mg/dL Final         Passed - HGB in normal range and within 360 days    Hemoglobin  Date Value Ref Range Status  07/25/2019 14.3 13.0 - 17.7 g/dL Final         Passed - Patient is not pregnant      Passed - Valid encounter within last 12 months    Recent Outpatient Visits          9 months ago Fatigue, unspecified type   [facility] Maple Hudson., MD   1 year ago OSA on CPAP   [facility] Maple Hudson., MD   1 year ago OSA on CPAP   [facility] Maple Hudson., MD   1 year ago TIA (transient ischemic attack)   [facility] Maple Hudson., MD   1 year ago Allergic contact dermatitis due to plants, except food   [facility] Sherrie Mustache, Demetrios Isaacs, MD

## 2020-06-04 ENCOUNTER — Other Ambulatory Visit: Payer: Self-pay

## 2020-06-04 ENCOUNTER — Ambulatory Visit (INDEPENDENT_AMBULATORY_CARE_PROVIDER_SITE_OTHER): Payer: BLUE CROSS/BLUE SHIELD | Admitting: Family Medicine

## 2020-06-04 ENCOUNTER — Encounter: Payer: Self-pay | Admitting: Family Medicine

## 2020-06-04 VITALS — BP 112/78 | HR 69 | Temp 98.2°F | Resp 16 | Ht 72.0 in | Wt 225.0 lb

## 2020-06-04 DIAGNOSIS — Z1389 Encounter for screening for other disorder: Secondary | ICD-10-CM | POA: Diagnosis not present

## 2020-06-04 DIAGNOSIS — G4733 Obstructive sleep apnea (adult) (pediatric): Secondary | ICD-10-CM | POA: Diagnosis not present

## 2020-06-04 DIAGNOSIS — Z0000 Encounter for general adult medical examination without abnormal findings: Secondary | ICD-10-CM

## 2020-06-04 DIAGNOSIS — F331 Major depressive disorder, recurrent, moderate: Secondary | ICD-10-CM

## 2020-06-04 DIAGNOSIS — H9193 Unspecified hearing loss, bilateral: Secondary | ICD-10-CM

## 2020-06-04 LAB — POCT URINALYSIS DIPSTICK
Bilirubin, UA: NEGATIVE
Blood, UA: NEGATIVE
Glucose, UA: NEGATIVE
Ketones, UA: NEGATIVE
Leukocytes, UA: NEGATIVE
Nitrite, UA: NEGATIVE
Protein, UA: NEGATIVE
Spec Grav, UA: 1.02 (ref 1.010–1.025)
Urobilinogen, UA: 0.2 E.U./dL
pH, UA: 6 (ref 5.0–8.0)

## 2020-06-04 NOTE — Progress Notes (Signed)
I,April Miller,acting as a scribe for Megan Mans, MD.,have documented all relevant documentation on the behalf of Megan Mans, MD,as directed by  Megan Mans, MD while in the presence of Megan Mans, MD.   Complete physical exam   Patient: Shane Guerrero   DOB: 02/24/1980   39 y.o. Male  MRN: [id number] Visit Date: 06/04/2020  Today's healthcare provider: Megan Mans, MD   Chief Complaint  Patient presents with  . Annual Exam   Subjective    Shane Guerrero is a 40 y.o. male who presents today for a complete physical exam.  He reports consuming a general diet.  He generally feels fairly well. He reports sleeping fairly well. He does not have additional problems to discuss today.  He is married and is a father of 1 son, works for the [employer]. HPI  He has been diagnosed with sleep apnea is waiting on his CPAP. Also is waiting on hearing aids.  History reviewed. No pertinent past medical history. Past Surgical History:  Procedure Laterality Date  . SEPTOPLASTY     Social History   Socioeconomic History  . Marital status: Married    Spouse name: Not on file  . Number of children: Not on file  . Years of education: Not on file  . Highest education level: Not on file  Occupational History  . Not on file  Tobacco Use  . Smoking status: Never Smoker  . Smokeless tobacco: Never Used  Vaping Use  . Vaping Use: Never used  Substance and Sexual Activity  . Alcohol use: Yes    Alcohol/week: 0.0 standard drinks    Comment: occasionally  . Drug use: No  . Sexual activity: Not on file  Other Topics Concern  . Not on file  Social History Narrative  . Not on file   Social Determinants of Health   Financial Resource Strain: Not on file  Food Insecurity: Not on file  Transportation Needs: Not on file  Physical Activity: Not on file  Stress: Not on file  Social Connections: Not on file  Intimate Partner Violence: Not on file    Family Status  Relation Name Status  . Mother  Alive  . Father  Alive  . Brother 1 Alive  . Brother 2 Alive  . MGF  (Not Specified)   Family History  Problem Relation Age of Onset  . Healthy Mother   . Hyperlipidemia Father   . Healthy Brother   . Sleep apnea Brother   . Heart disease Maternal Grandfather   . Lung cancer Maternal Grandfather    Allergies  Allergen Reactions  . Abilify [Aripiprazole] Other (See Comments)    Excessive hypersomnia and increased appetite    Patient Care Team: Maple Hudson., MD as PCP - General (Family Medicine)   Medications: Outpatient Medications Prior to Visit  Medication Sig  . amphetamine-dextroamphetamine (ADDERALL) 15 MG tablet Take 15 mg by mouth 2 (two) times daily.   . B Complex Vitamins (B COMPLEX 1 PO) Take by mouth.  . clonazePAM (KLONOPIN) 0.5 MG tablet Take 0.5 mg by mouth 2 (two) times daily as needed.  Marland Kitchen FLUoxetine (PROZAC) 40 MG capsule Take 40 mg by mouth daily.  Marland Kitchen lamoTRIgine (LAMICTAL) 100 MG tablet Take 100 mg by mouth in the morning and at bedtime.  . naproxen (NAPROSYN) 500 MG tablet TAKE 1 TABLET BY MOUTH TWICE (2) DAILY WITH A MEAL  . topiramate (TOPAMAX) 50 MG tablet  Take 25 mg by mouth daily.   Marland Kitchen doxycycline (VIBRA-TABS) 100 MG tablet Take 1 tablet (100 mg total) by mouth 2 (two) times daily. (Patient not taking: Reported on 06/04/2020)   No facility-administered medications prior to visit.    Review of Systems  Respiratory: Positive for apnea.   All other systems reviewed and are negative.      Objective    BP 112/78 (BP Location: Left Arm, Patient Position: Sitting, Cuff Size: Large)   Pulse 69   Temp 98.2 F (36.8 C) (Oral)   Resp 16   Ht 6' (1.829 m)   Wt 225 lb (102.1 kg)   SpO2 97%   BMI 30.52 kg/m     Physical Exam Vitals reviewed.  Constitutional:      Appearance: He is well-developed.  HENT:     Head: Normocephalic and atraumatic.     Right Ear: External ear normal.      Left Ear: External ear normal.     Nose: Nose normal.  Eyes:     General: No scleral icterus.    Conjunctiva/sclera: Conjunctivae normal.     Pupils: Pupils are equal, round, and reactive to light.  Neck:     Thyroid: No thyromegaly.  Cardiovascular:     Rate and Rhythm: Normal rate and regular rhythm.     Heart sounds: Normal heart sounds.  Pulmonary:     Effort: Pulmonary effort is normal.     Breath sounds: Normal breath sounds.  Abdominal:     Palpations: Abdomen is soft.  Genitourinary:    Penis: Normal.      Testes: Normal.  Musculoskeletal:     Right lower leg: No edema.     Left lower leg: No edema.  Lymphadenopathy:     Cervical: No cervical adenopathy.  Skin:    General: Skin is warm and dry.  Neurological:     General: No focal deficit present.     Mental Status: He is alert and oriented to person, place, and time.     Cranial Nerves: No cranial nerve deficit.     Motor: No weakness.     Coordination: Coordination normal.     Gait: Gait normal.  Psychiatric:        Mood and Affect: Mood normal.        Behavior: Behavior normal.        Thought Content: Thought content normal.        Judgment: Judgment normal.       Last depression screening scores PHQ 2/9 Scores 06/04/2020 03/21/2019 03/21/2019  PHQ - 2 Score 2 0 0  PHQ- 9 Score 8 6 -   Last fall risk screening Fall Risk  02/16/2017  Falls in the past year? No   Last Audit-C alcohol use screening Alcohol Use Disorder Test (AUDIT) 06/04/2020  1. How often do you have a drink containing alcohol? 1  2. How many drinks containing alcohol do you have on a typical day when you are drinking? 0  3. How often do you have six or more drinks on one occasion? 0  AUDIT-C Score 1   A score of 3 or more in women, and 4 or more in men indicates increased risk for alcohol abuse, EXCEPT if all of the points are from question 1   Results for orders placed or performed in visit on 06/04/20  POCT urinalysis dipstick   Result Value Ref Range   Color, UA Dark Yellow    Clarity, UA  Clear    Glucose, UA Negative Negative   Bilirubin, UA Negative    Ketones, UA Negative    Spec Grav, UA 1.020 1.010 - 1.025   Blood, UA Negative    pH, UA 6.0 5.0 - 8.0   Protein, UA Negative Negative   Urobilinogen, UA 0.2 0.2 or 1.0 E.U./dL   Nitrite, UA Negative    Leukocytes, UA Negative Negative    Assessment & Plan    Routine Health Maintenance and Physical Exam  Exercise Activities and Dietary recommendations Goals   None     Immunization History  Administered Date(s) Administered  . Influenza,inj,Quad PF,6+ Mos 01/20/2016, 03/09/2017, 11/21/2018  . Moderna Sars-Covid-2 Vaccination 05/25/2019, 06/22/2019  . Tdap 08/16/2013    Health Maintenance  Topic Date Due  . Hepatitis C Screening  Never done  . COVID-19 Vaccine (3 - Booster) 12/23/2019  . INFLUENZA VACCINE  09/02/2020  . TETANUS/TDAP  08/17/2023  . HIV Screening  Completed  . HPV VACCINES  Aged Out    Discussed health benefits of physical activity, and encouraged him to engage in regular exercise appropriate for his age and condition.  1. Annual physical exam  - CBC with Differential/Platelet - Comprehensive metabolic panel - Lipid panel - TSH  2. Obstructive sleep apnea Awaiting CPAP - CBC with Differential/Platelet - Comprehensive metabolic panel - Lipid panel - TSH  3. Moderate episode of recurrent major depressive disorder [facility]) Psychiatrist is Dr. Daleen Squibb in [place] - CBC with Differential/Platelet - Comprehensive metabolic panel - Lipid panel - TSH  4. Screening for blood or protein in urine  - POCT urinalysis dipstick--Negative 5.  Hearing loss Awaiting hearing aides  Return in about 1 year (around 06/04/2021).        Elizabelle Fite Wendelyn Breslow, MD  [facility] [phone] (phone) [phone] (fax)  [facility]

## 2020-10-14 ENCOUNTER — Other Ambulatory Visit: Payer: Self-pay | Admitting: Family Medicine

## 2020-10-14 DIAGNOSIS — M25562 Pain in left knee: Secondary | ICD-10-CM

## 2020-10-14 DIAGNOSIS — G8929 Other chronic pain: Secondary | ICD-10-CM

## 2021-04-16 IMAGING — MR MR HEAD WO/W CM
15 of 16 series · 39 of 48 positions shown · IV contrast (10ml Gadavist)
Comparison: Previous MRI from 07/24/2007.

CLINICAL DATA: Initial evaluation for recent episode of
forgetfulness, headache. Long history of migraines.

EXAM:
MRI HEAD WITHOUT AND WITH CONTRAST
MRA HEAD WITHOUT CONTRAST
TECHNIQUE: Multiplanar, multiecho pulse sequences of the brain and surrounding
structures were obtained without and with intravenous contrast.
Angiographic images of the head were obtained using MRA technique
without contrast.
CONTRAST:  10mL GADAVIST GADOBUTROL 1 MMOL/ML IV SOLN

[Series 5: ax dwi_tracew · axial · 3.0mm · 0.62mm/px · z∈[-97,+70]mm · 3 of 52 slices shown]
[im 1/52]
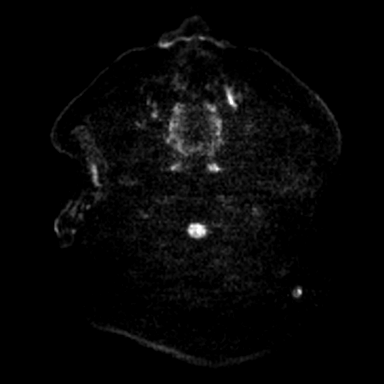
[im 26/52]
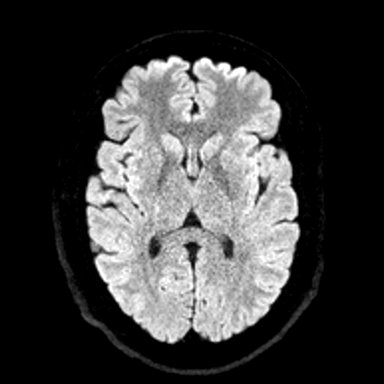
[im 52/52]
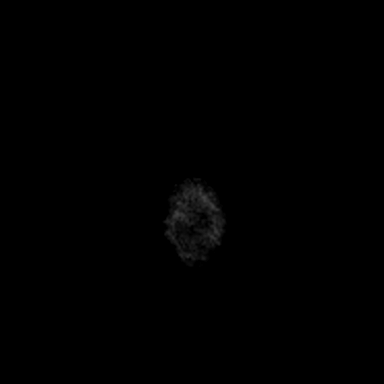

[Series 6: ax dwi_adc · axial · 3.0mm · 0.62mm/px · z∈[-97,+70]mm · 2 of 52 slices shown]
[im 1/52]
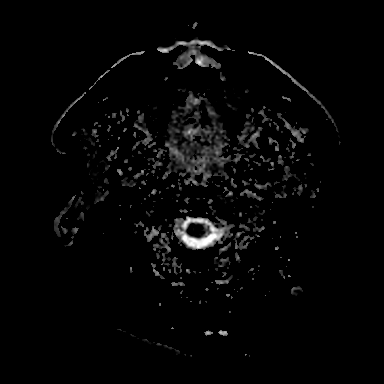
[im 52/52]
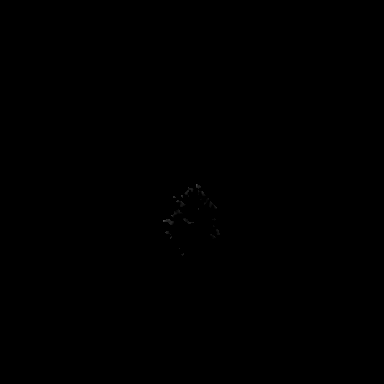

[Series 7: cor dwi_tracew · coronal · 5.0mm · 0.60mm/px · 2 of 40 slices shown]
[im 1/40]
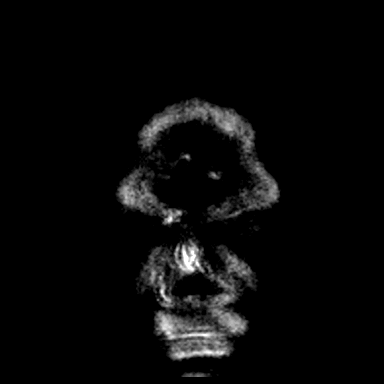
[im 40/40]
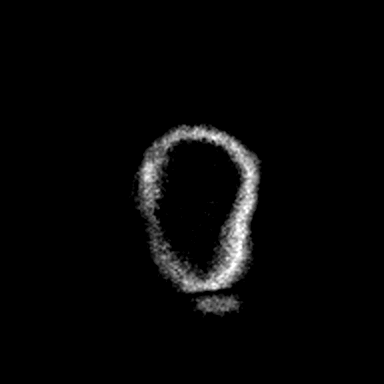

[Series 8: cor dwi_adc · coronal · 5.0mm · 0.60mm/px · 2 of 40 slices shown]
[im 1/40]
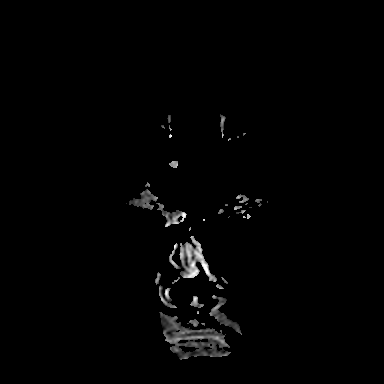
[im 40/40]
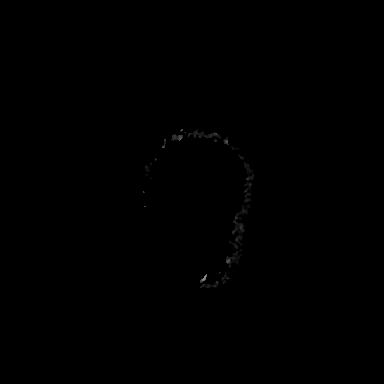

[Series 14: T1 · sagittal · 5.0mm · 0.62mm/px · 1 of 25 slices shown (1 of 2)]
[im 1/25]
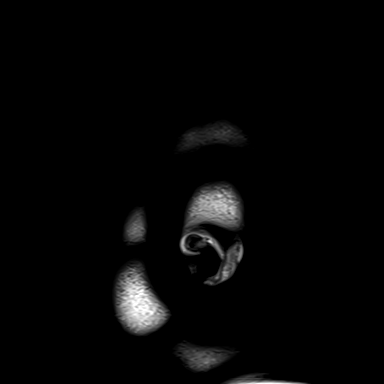

[Series 15: T2 · axial · 5.0mm · 0.58mm/px · 1 of 28 slices shown]
[im 1/28]
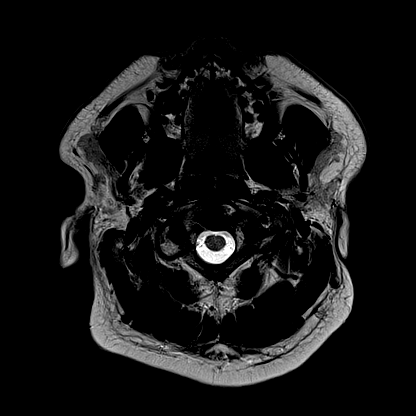

[Series 17: pha_images · axial · 3.0mm · 0.94mm/px · z∈[-102,+71]mm · 3 of 59 slices shown]
[im 1/59]
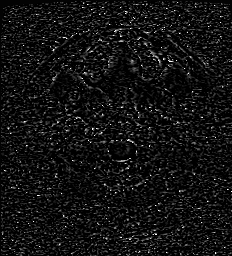
[im 30/59]
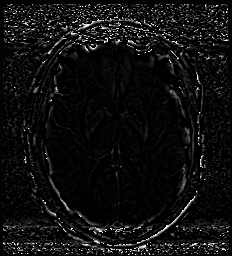
[im 59/59]
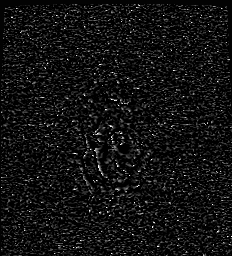

[Series 18: swi_images · axial · 3.0mm · 0.94mm/px · z∈[-102,+74]mm · 3 of 60 slices shown]
[im 1/60]
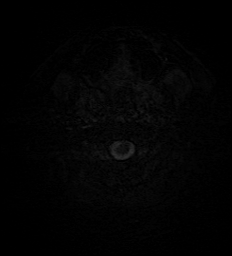
[im 30/60]
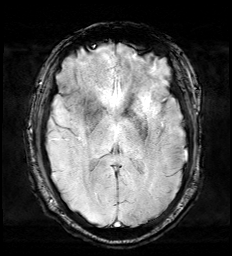
[im 60/60]
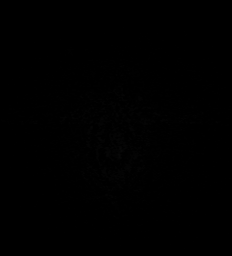

[Series 20: FLAIR · axial · 3.0mm · 0.58mm/px · z∈[-94,+67]mm · 2 of 55 slices shown]
[im 1/55]
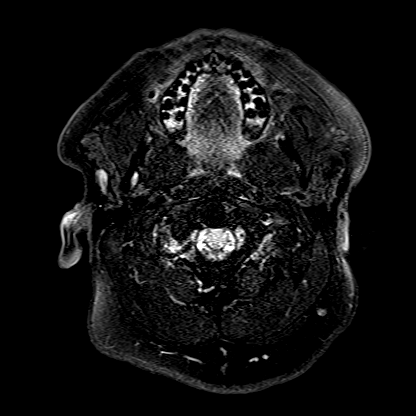
[im 55/55]
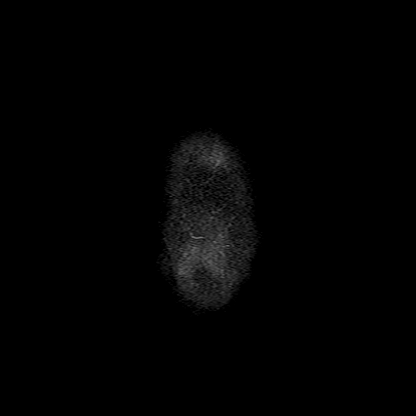

[Series 21: T1 · axial · 1.0mm · 0.98mm/px · z∈[-102,+73]mm · 8 of 176 slices shown (2 of 2)]
[im 1/176]
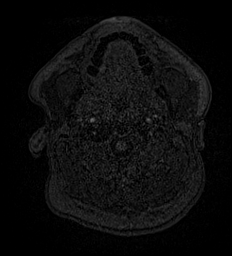
[im 26/176]
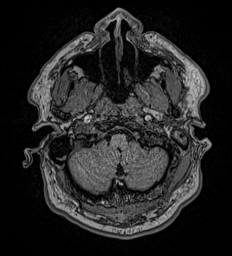
[im 51/176]
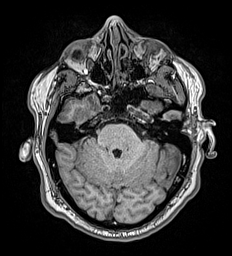
[im 76/176]
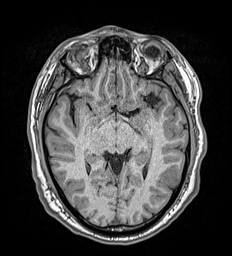
[im 101/176]
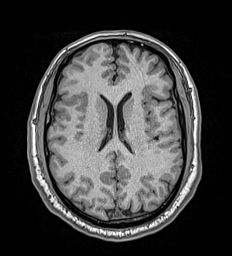
[im 126/176]
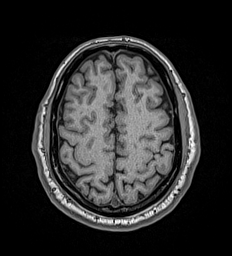
[im 151/176]
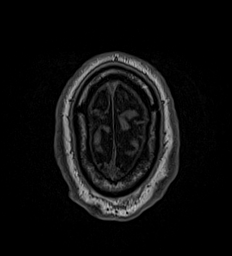
[im 176/176]
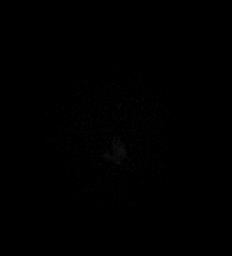

[Series 22: T2 post-contrast · coronal · 5.0mm · 0.57mm/px · 1 of 31 slices shown]
[im 1/31]
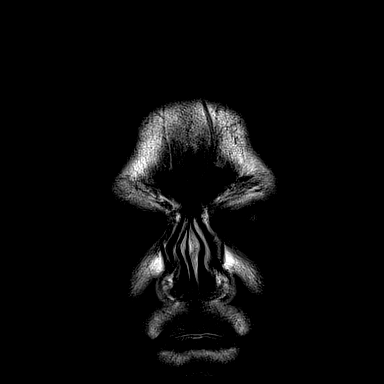

[Series 23: T1 post-contrast · axial · 1.0mm · 0.98mm/px · z∈[-102,+73]mm · 8 of 176 slices shown (1 of 4)]
[im 1/176]
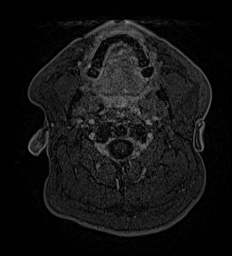
[im 26/176]
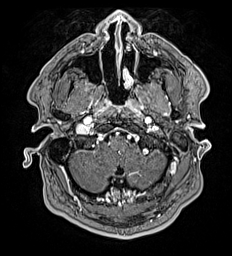
[im 51/176]
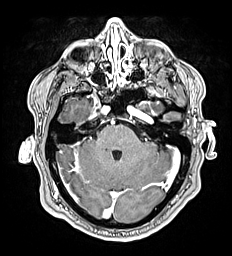
[im 76/176]
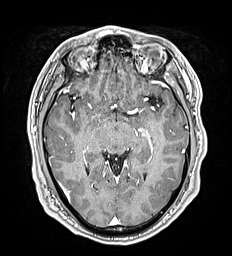
[im 101/176]
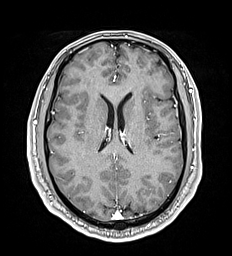
[im 126/176]
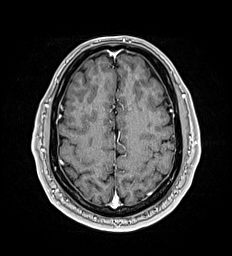
[im 151/176]
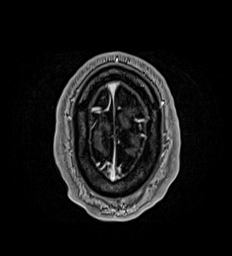
[im 176/176]
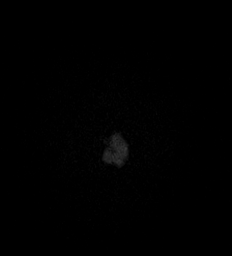

[Series 24: T1 post-contrast · sagittal · 5.0mm · 0.62mm/px · 1 of 25 slices shown (2 of 4)]
[im 1/25]
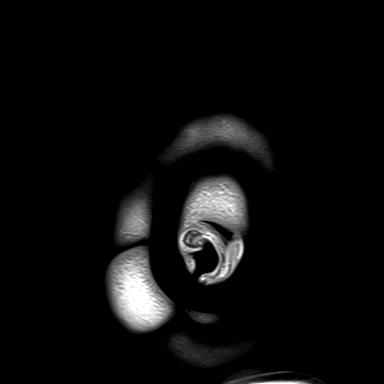

[Series 25: T1 post-contrast · coronal · 5.0mm · 0.90mm/px · 1 of 31 slices shown (3 of 4)]
[im 1/31]
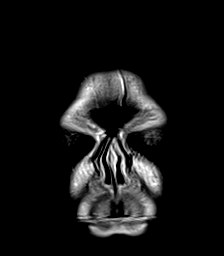

[Series 26: T1 post-contrast · coronal · 5.0mm · 0.90mm/px · 1 of 31 slices shown (4 of 4)]
[im 1/31]
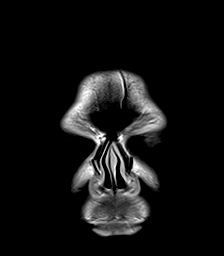

[39 of 48 positions shown; findings below may reference images not displayed]

FINDINGS: MRI HEAD FINDINGS

Brain: Cerebral volume within normal limits for patient age. No
focal parenchymal signal abnormality identified.

No abnormal foci of restricted diffusion to suggest acute or
subacute ischemia. Gray-white matter differentiation well
maintained. No encephalomalacia to suggest chronic infarction. No
foci of susceptibility artifact to suggest acute or chronic
intracranial hemorrhage.

No mass lesion, midline shift or mass effect. No hydrocephalus. No
extra-axial fluid collection. Major dural sinuses are grossly
patent.

Pituitary gland prominent with convex border superiorly. No definite
discrete lesions seen on this non pituitary protocol MRI. Midline
structures intact and normal.

No abnormal enhancement.

Vascular: Major intracranial vascular flow voids well maintained and
normal in appearance.

Skull and upper cervical spine: Craniocervical junction normal.
Visualized upper cervical spine within normal limits. Bone marrow
signal intensity normal. No scalp soft tissue abnormality.

Sinuses/Orbits: Globes and orbital soft tissues within normal
limits.

Mild scattered mucosal thickening present within the ethmoidal air
cells. Paranasal sinuses are otherwise clear. Trace bilateral
mastoid effusions, of doubtful significance. Inner ear structures
grossly normal. Visualized nasopharynx within normal limits.

Other: None.

MRA HEAD FINDINGS

ANTERIOR CIRCULATION:

Visualized distal cervical segments of the internal carotid arteries
are widely patent with symmetric antegrade flow. Petrous, cavernous,
and supraclinoid ICAs widely patent without stenosis. A1 segments
widely patent. Normal anterior communicating artery complex.
Anterior cerebral arteries widely patent to their distal aspects
without stenosis. No M1 stenosis or occlusion. Normal MCA
bifurcations. Distal MCA branches well perfused and symmetric.

POSTERIOR CIRCULATION:

Vertebral arteries widely patent to the vertebrobasilar junction
without stenosis. Left vertebral artery dominant. Visualized
posterior inferior cerebral arteries patent bilaterally. Basilar
widely patent to its distal aspect without stenosis. Superior
cerebral arteries patent bilaterally. Both PCAs primarily supplied
via the basilar well perfused to their distal aspects.

No intracranial aneurysm or other vascular abnormality.
IMPRESSION: 1. Normal MRI of the brain. No structural findings to explain
patient's symptoms identified.
2. Normal intracranial MRA.

## 2021-05-29 ENCOUNTER — Other Ambulatory Visit: Payer: Self-pay | Admitting: Family Medicine

## 2021-05-29 DIAGNOSIS — G8929 Other chronic pain: Secondary | ICD-10-CM

## 2021-06-02 ENCOUNTER — Telehealth: Payer: Self-pay | Admitting: Family Medicine

## 2021-06-02 NOTE — Telephone Encounter (Signed)
Pt is calling to reschedule his cpe. No available appts on Dr. Alben Spittle template. Office staff on lunch. Please advise with pt the next available date to reschedule. ?Please advise  ?

## 2021-06-05 ENCOUNTER — Encounter: Payer: Self-pay | Admitting: Family Medicine

## 2021-07-05 ENCOUNTER — Other Ambulatory Visit: Payer: Self-pay | Admitting: Family Medicine

## 2021-07-05 DIAGNOSIS — G8929 Other chronic pain: Secondary | ICD-10-CM

## 2021-11-06 ENCOUNTER — Encounter: Payer: BLUE CROSS/BLUE SHIELD | Admitting: Family Medicine

## 2022-01-18 ENCOUNTER — Encounter: Payer: Self-pay | Admitting: Emergency Medicine

## 2022-01-18 ENCOUNTER — Emergency Department
Admission: EM | Admit: 2022-01-18 | Discharge: 2022-01-18 | Disposition: A | Discharge status: Home / Self Care | Payer: BLUE CROSS/BLUE SHIELD | Attending: Emergency Medicine | Admitting: Emergency Medicine

## 2022-01-18 ENCOUNTER — Other Ambulatory Visit: Payer: Self-pay

## 2022-01-18 DIAGNOSIS — M791 Myalgia, unspecified site: Secondary | ICD-10-CM | POA: Diagnosis present

## 2022-01-18 DIAGNOSIS — Y92410 Unspecified street and highway as the place of occurrence of the external cause: Secondary | ICD-10-CM | POA: Insufficient documentation
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
## 2022-06-14 ENCOUNTER — Emergency Department: Payer: BLUE CROSS/BLUE SHIELD

## 2022-06-14 ENCOUNTER — Emergency Department
Admission: EM | Admit: 2022-06-14 | Discharge: 2022-06-14 | Disposition: A | Discharge status: Home / Self Care | Payer: BLUE CROSS/BLUE SHIELD | Attending: Emergency Medicine | Admitting: Emergency Medicine

## 2022-06-14 DIAGNOSIS — G4700 Insomnia, unspecified: Secondary | ICD-10-CM | POA: Insufficient documentation

## 2022-06-14 DIAGNOSIS — R569 Unspecified convulsions: Secondary | ICD-10-CM | POA: Diagnosis present

## 2022-06-14 LAB — COMPREHENSIVE METABOLIC PANEL
ALT: 29 U/L (ref 0–44)
AST: 34 U/L (ref 15–41)
Albumin: 3.7 g/dL (ref 3.5–5.0)
Alkaline Phosphatase: 125 U/L (ref 38–126)
Anion gap: 10 (ref 5–15)
BUN: 12 mg/dL (ref 6–20)
CO2: 21 mmol/L — ABNORMAL LOW (ref 22–32)
Calcium: 8.7 mg/dL — ABNORMAL LOW (ref 8.9–10.3)
Chloride: 102 mmol/L (ref 98–111)
Creatinine, Ser: 0.99 mg/dL (ref 0.61–1.24)
GFR, Estimated: >60 mL/min (ref >60)
Glucose, Bld: 112 mg/dL — ABNORMAL HIGH (ref 70–99)
Potassium: 3.8 mmol/L (ref 3.5–5.1)
Sodium: 133 mmol/L — ABNORMAL LOW (ref 135–145)
Total Bilirubin: 0.7 mg/dL (ref 0.3–1.2)
Total Protein: 6.1 g/dL — ABNORMAL LOW (ref 6.5–8.1)

## 2022-06-14 LAB — CBC WITH DIFFERENTIAL/PLATELET
Abs Immature Granulocytes: 0.05 10*3/uL (ref 0.00–0.07)
Basophils Absolute: 0.1 10*3/uL (ref 0.0–0.1)
Basophils Relative: 2 %
Eosinophils Absolute: 0.5 10*3/uL (ref 0.0–0.5)
Eosinophils Relative: 7 %
HCT: 43.3 % (ref 39.0–52.0)
Hemoglobin: 14.3 g/dL (ref 13.0–17.0)
Immature Granulocytes: 1 %
Lymphocytes Relative: 30 %
Lymphs Abs: 2.2 10*3/uL (ref 0.7–4.0)
MCH: 27.2 pg (ref 26.0–34.0)
MCHC: 33 g/dL (ref 30.0–36.0)
MCV: 82.5 fL (ref 80.0–100.0)
Monocytes Absolute: 0.6 10*3/uL (ref 0.1–1.0)
Monocytes Relative: 8 %
Neutro Abs: 3.9 10*3/uL (ref 1.7–7.7)
Neutrophils Relative %: 52 %
Platelets: 346 10*3/uL (ref 150–400)
RBC: 5.25 MIL/uL (ref 4.22–5.81)
RDW: 13.6 % (ref 11.5–15.5)
WBC: 7.3 10*3/uL (ref 4.0–10.5)
nRBC: 0 % (ref 0.0–0.2)

## 2022-06-14 LAB — ACETAMINOPHEN LEVEL: Acetaminophen (Tylenol), Serum: <10 ug/mL — ABNORMAL LOW (ref 10–30)

## 2022-06-14 LAB — SALICYLATE LEVEL: Salicylate Lvl: <7 mg/dL — ABNORMAL LOW (ref 7.0–30.0)

## 2022-06-14 LAB — ETHANOL: Alcohol, Ethyl (B): <10 mg/dL (ref <10)

## 2022-06-14 MED ORDER — SODIUM CHLORIDE 0.9 % IV BOLUS
1000.0000 mL | Freq: Once | INTRAVENOUS | Status: AC
  Administered 2022-06-14: 1000 mL via INTRAVENOUS

## 2022-06-14 MED ORDER — LORAZEPAM 2 MG/ML IJ SOLN
1.0000 mg | Freq: Once | INTRAMUSCULAR | Status: AC
  Administered 2022-06-14: 1 mg via INTRAVENOUS
  Filled 2022-06-14: qty 1

## 2022-06-14 MED ORDER — KETOROLAC TROMETHAMINE 30 MG/ML IJ SOLN
15.0000 mg | Freq: Once | INTRAMUSCULAR | Status: AC
  Administered 2022-06-14: 15 mg via INTRAVENOUS
  Filled 2022-06-14: qty 1

## 2022-06-14 NOTE — ED Notes (Addendum)
Sleeping, arousable to voice, alert, NAD, calm, interactive, endorses back pain, no change, 7/10. Calling for a ride. Snack/ drink given. Using phone to call for ride.

## 2022-06-14 NOTE — ED Triage Notes (Signed)
Patient C/O seizure-like activity. Patient was playing video games with his young son when he become unresponsive. Patient's son called 911 and per EMS patient was postictal upon arrival.

## 2022-06-14 NOTE — Discharge Instructions (Signed)
You experience seizure-like activity tonight which may be a result of insomnia.  Do not drive or operate heavy machinery until seen by your neurologist.  Return to the ER for recurrent or worsening symptoms, persistent vomiting, difficulty breathing or other concerns.

## 2022-06-14 NOTE — ED Provider Notes (Signed)
[facility] Provider Note    Event Date/Time   First MD Initiated Contact with Patient 06/14/22 0009     (approximate)   History   Seizure-like activity  HPI  Shane Guerrero is a 42 y.o. male brought to the ED via EMS from home with a chief complaint of seizure-like activity.  Patient was playing [employer] video games with his 63-year-old son and the son said he became unresponsive and was shaking.  EMS reports patient was postictal upon their arrival and had bitten his tongue.  Patient does not have history of seizure disorder.  States he has not been sleeping for the past 2 weeks secondary to stress and depression.  Denies active SI/HI/AH/VH.  Has not been wearing his CPAP at night as directed by his doctor.  Denies recent illness, fever/chills, chest pain, shortness of breath, abdominal pain, nausea, vomiting or dizziness.  Denies EtOH or illicit drug use.     Past Medical History  No past medical history on file.   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 Authorizing Provider  amphetamine-dextroamphetamine (ADDERALL) 15 MG tablet Take 15 mg by mouth 2 (two) times daily.  02/25/17   [provider]  B Complex Vitamins (B COMPLEX 1 PO) Take by mouth.    [provider]  clonazePAM (KLONOPIN) 0.5 MG tablet Take 0.5 mg by mouth 2 (two) times daily as needed. 10/07/19   [provider]  doxycycline (VIBRA-TABS) 100 MG tablet  Take 1 tablet (100 mg total) by mouth 2 (two) times daily. Patient not taking: Reported on 06/04/2020 10/16/19   Coral Ceo, NP  FLUoxetine (PROZAC) 40 MG capsule Take 40 mg by mouth daily. 04/26/19   [provider]  lamoTRIgine (LAMICTAL) 100 MG tablet Take 100 mg by mouth in the morning and at bedtime. 05/13/20   [provider]  naproxen (NAPROSYN) 500 MG tablet TAKE 1 TABLET BY MOUTH TWICE (2) DAILY WITH A MEAL 07/07/21   Bosie Clos, MD  topiramate (TOPAMAX) 50 MG tablet Take 25 mg by mouth daily.  07/18/19   [provider]     Allergies  Abilify [aripiprazole]   Family History   Family History  Problem Relation Age of Onset   Healthy Mother    Hyperlipidemia Father    Healthy Brother    Sleep apnea Brother    Heart disease Maternal Grandfather    Lung cancer Maternal Grandfather      Physical Exam  Triage Vital Signs: ED Triage Vitals  Enc Vitals Group     BP      Pulse      Resp      Temp      Temp src      SpO2      Weight      Height      Head Circumference      Peak Flow  Pain Score      Pain Loc      Pain Edu?      Excl. in GC?     Updated Vital Signs: BP (!) 142/75   Pulse 87   Temp 98.1 F (36.7 C) (Oral)   Resp 14   Ht 6' (1.829 m)   Wt 108.9 kg   SpO2 97%   BMI 32.55 kg/m    General: Awake, no distress.  CV:  RRR.  Good peripheral perfusion.  Resp:  Normal effort.  CTAB. Abd:  Nontender.  No distention.  Other:  Bit tongue, no active bleeding.  Head is atraumatic.  PERRL.  EOMI.  Alert and oriented x 3.  CN II toXII sling intact.  5/5 motor strength and sensation all extremities.   ED Results / Procedures / Treatments  Labs (all labs ordered are listed, but only abnormal results are displayed) Labs Reviewed  COMPREHENSIVE METABOLIC PANEL - Abnormal; Notable for the following components:      Result Value   Sodium 133 (*)    CO2 21 (*)    Glucose, Bld 112 (*)    Calcium 8.7 (*)    Total  Protein 6.1 (*)    All other components within normal limits  ACETAMINOPHEN LEVEL - Abnormal; Notable for the following components:   Acetaminophen (Tylenol), Serum <10 (*)    All other components within normal limits  SALICYLATE LEVEL - Abnormal; Notable for the following components:   Salicylate Lvl <7.0 (*)    All other components within normal limits  CBC WITH DIFFERENTIAL/PLATELET  ETHANOL  URINE DRUG SCREEN, QUALITATIVE ([facility] ONLY)  URINALYSIS, ROUTINE W REFLEX MICROSCOPIC     EKG  ED ECG REPORT I, Aurorah Schlachter J, the attending physician, personally viewed and interpreted this ECG.   Date: 06/14/2022  EKG Time: 0013  Rate: 69  Rhythm: normal sinus rhythm  Axis: Normal  Intervals:none  ST&T Change: Nonspecific    RADIOLOGY I have independently visualized and interpreted patient's CT as well as noted the radiology interpretation:  CT head: No ICH  Official radiology report(s): CT Head Wo Contrast  Result Date: 06/14/2022 CLINICAL DATA:  Seizure-like activity. EXAM: CT HEAD WITHOUT CONTRAST TECHNIQUE: Contiguous axial images were obtained from the base of the skull through the vertex without intravenous contrast. RADIATION DOSE REDUCTION: This exam was performed according to the departmental dose-optimization program which includes automated exposure control, adjustment of the mA and/or kV according to patient size and/or use of iterative reconstruction technique. COMPARISON:  None Available. FINDINGS: Brain: No evidence of acute infarction, hemorrhage, hydrocephalus, extra-axial collection or mass lesion/mass effect. Vascular: No hyperdense vessel or unexpected calcification. Skull: Normal. Negative for fracture or focal lesion. Sinuses/Orbits: No acute finding. Other: None. IMPRESSION: No acute intracranial pathology. Electronically Signed   By: Aram Candela M.D.   On: 06/14/2022 01:32     PROCEDURES:  Critical Care performed: No patient was unable  .1-3 Lead EKG  Interpretation  Performed by: Irean Hong, MD Authorized by: Irean Hong, MD     Interpretation: normal     ECG rate:  65   ECG rate assessment: normal     Rhythm: sinus rhythm     Ectopy: none     Conduction: normal   Comments:     Patient placed on cardiac monitor to evaluate for arrhythmias    MEDICATIONS ORDERED IN ED: Medications  sodium chloride 0.9 % bolus 1,000 mL (0 mLs Intravenous Stopped 06/14/22 0408)  LORazepam (  ATIVAN) injection 1 mg (1 mg Intravenous Given 06/14/22 0024)  ketorolac (TORADOL) 30 MG/ML injection 15 mg (15 mg Intravenous Given 06/14/22 0445)     IMPRESSION / MDM / ASSESSMENT AND PLAN / ED COURSE  I reviewed the triage vital signs and the nursing notes.                             42 year old male presenting with seizure-like activity.  Differential diagnosis includes but is not limited to seizure, ICH, CVA, metabolic, infectious, psychosocial etiologies, etc.  I personally reviewed patient's records and note a last PCP office visit in December 2023 for respiratory tract infection.  Patient's presentation is most consistent with acute presentation with potential threat to life or bodily function.  The patient is on the cardiac monitor to evaluate for evidence of arrhythmia and/or significant heart rate changes.  Will obtain toxicological workup, CT head.  He is on seizure precautions.  Administer 1 mg IV Ativan for both seizure prevention as well as for rest.  Will reassess.  Clinical Course as of 06/14/22 1610  Wynelle Link Jun 14, 2022  0142 Patient sleeping soundly after administration of IV Ativan.  Laboratory results demonstrate normal WBC 7.3, minimal hyponatremia sodium 133, negative EtOH/acetaminophen/salicylate; CT head is unremarkable.  Will continue to monitor and care for patient. [JS]  [id number] Patient remains soundly sleeping in no acute distress.  Will continue to monitor and care for patient. [JS]  0425 Patient awake, tried to provide urine  specimen but states he does not need to go.  Updated him on all laboratory and CT results.  He already sees neurology for migraine headaches; will follow-up with neurology for this episode of seizure-like activity.  Administer IV ketorolac for chronic back pain.  Strict return precautions given.  Patient verbalizes understanding and agrees with plan of care. [JS]  [id number] Patient was unable to find a ride overnight.  Remains in the emergency department until he can find a ride home this morning. [JS]    Clinical Course User Index [JS] Irean Hong, MD     FINAL CLINICAL IMPRESSION(S) / ED DIAGNOSES   Final diagnoses:  Seizure (HCC)  Insomnia, unspecified type     Rx / DC Orders   ED Discharge Orders     None        Note:  This document was prepared using Dragon voice recognition software and may include unintentional dictation errors.   Irean Hong, MD 06/14/22 [phone]

## 2022-06-22 ENCOUNTER — Other Ambulatory Visit: Payer: Self-pay | Admitting: Neurology

## 2022-06-22 DIAGNOSIS — R569 Unspecified convulsions: Secondary | ICD-10-CM

## 2022-06-27 ENCOUNTER — Ambulatory Visit
Admission: RE | Admit: 2022-06-27 | Discharge: 2022-06-27 | Disposition: A | Discharge status: Home / Self Care | Payer: BLUE CROSS/BLUE SHIELD | Source: Ambulatory Visit | Attending: Neurology | Admitting: Neurology

## 2022-06-27 DIAGNOSIS — R569 Unspecified convulsions: Secondary | ICD-10-CM
